# Patient Record
Sex: Female | Born: 1950 | Race: White | Hispanic: No | Marital: Married | Smoking: Former smoker
Health system: Southern US, Community
[De-identification: ages and names within clinical notes are randomized; demographics above are authoritative.]

## PROBLEM LIST (undated history)

## (undated) DIAGNOSIS — F329 Major depressive disorder, single episode, unspecified: Secondary | ICD-10-CM

## (undated) DIAGNOSIS — F909 Attention-deficit hyperactivity disorder, unspecified type: Secondary | ICD-10-CM

## (undated) DIAGNOSIS — F900 Attention-deficit hyperactivity disorder, predominantly inattentive type: Secondary | ICD-10-CM

## (undated) DIAGNOSIS — E079 Disorder of thyroid, unspecified: Secondary | ICD-10-CM

## (undated) DIAGNOSIS — D649 Anemia, unspecified: Secondary | ICD-10-CM

## (undated) DIAGNOSIS — R413 Other amnesia: Secondary | ICD-10-CM

## (undated) DIAGNOSIS — F32A Depression, unspecified: Secondary | ICD-10-CM

## (undated) DIAGNOSIS — R030 Elevated blood-pressure reading, without diagnosis of hypertension: Secondary | ICD-10-CM

## (undated) DIAGNOSIS — I872 Venous insufficiency (chronic) (peripheral): Secondary | ICD-10-CM

## (undated) DIAGNOSIS — Z79890 Hormone replacement therapy (postmenopausal): Secondary | ICD-10-CM

## (undated) HISTORY — DX: Major depressive disorder, single episode, unspecified: F32.9

## (undated) HISTORY — DX: Hormone replacement therapy: Z79.890

## (undated) HISTORY — PX: APPENDECTOMY: SHX54

## (undated) HISTORY — DX: Depression, unspecified: F32.A

## (undated) HISTORY — PX: BUNIONECTOMY: SHX129

## (undated) HISTORY — PX: TUBAL LIGATION: SHX77

## (undated) HISTORY — DX: Attention-deficit hyperactivity disorder, unspecified type: F90.9

## (undated) HISTORY — DX: Other amnesia: R41.3

## (undated) HISTORY — PX: ABDOMINAL HYSTERECTOMY: SHX81

## (undated) HISTORY — PX: CHOLECYSTECTOMY: SHX55

## (undated) HISTORY — PX: ESOPHAGOGASTRODUODENOSCOPY: SHX1529

## (undated) HISTORY — PX: OTHER SURGICAL HISTORY: SHX169

## (undated) HISTORY — DX: Anemia, unspecified: D64.9

## (undated) HISTORY — DX: Elevated blood-pressure reading, without diagnosis of hypertension: R03.0

## (undated) HISTORY — DX: Disorder of thyroid, unspecified: E07.9

---
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
## 2013-01-25 LAB — HM COLONOSCOPY

## 2016-11-08 DIAGNOSIS — E039 Hypothyroidism, unspecified: Secondary | ICD-10-CM | POA: Insufficient documentation

## 2016-11-13 DIAGNOSIS — I872 Venous insufficiency (chronic) (peripheral): Secondary | ICD-10-CM

## 2016-11-13 HISTORY — DX: Venous insufficiency (chronic) (peripheral): I87.2

## 2017-02-08 DIAGNOSIS — E559 Vitamin D deficiency, unspecified: Secondary | ICD-10-CM | POA: Insufficient documentation

## 2017-05-04 DIAGNOSIS — F900 Attention-deficit hyperactivity disorder, predominantly inattentive type: Secondary | ICD-10-CM

## 2017-05-04 DIAGNOSIS — R413 Other amnesia: Secondary | ICD-10-CM | POA: Insufficient documentation

## 2017-05-04 HISTORY — DX: Attention-deficit hyperactivity disorder, predominantly inattentive type: F90.0

## 2017-05-19 LAB — HM MAMMOGRAPHY

## 2017-05-19 LAB — HM DEXA SCAN: HM Dexa Scan: NORMAL

## 2017-06-10 DIAGNOSIS — Z79890 Hormone replacement therapy (postmenopausal): Secondary | ICD-10-CM | POA: Insufficient documentation

## 2017-12-04 DIAGNOSIS — D649 Anemia, unspecified: Secondary | ICD-10-CM | POA: Insufficient documentation

## 2017-12-04 HISTORY — DX: Anemia, unspecified: D64.9

## 2018-03-05 DIAGNOSIS — E782 Mixed hyperlipidemia: Secondary | ICD-10-CM | POA: Insufficient documentation

## 2018-03-06 LAB — BASIC METABOLIC PANEL
Creatinine: 0.8 (ref <=1.1)
Glucose: 83
Potassium: 4.6 (ref 3.4–5.3)
Sodium: 142 (ref 137–147)

## 2018-03-06 LAB — CBC AND DIFFERENTIAL
HCT: 34 — AB (ref 36–46)
Hemoglobin: 11.9 — AB (ref 12.0–16.0)
Platelets: 356 (ref 150–399)
WBC: 7.5

## 2018-03-06 LAB — HEPATIC FUNCTION PANEL
ALT: 11 (ref 7–35)
AST: 14 (ref 13–35)

## 2018-03-06 LAB — TSH: TSH: 1.01 (ref 0.41–5.90)

## 2018-03-07 LAB — LIPID PANEL
Cholesterol: 181 (ref 0–200)
HDL: 58 (ref 35–70)
Triglycerides: 104 (ref 40–160)

## 2018-05-23 ENCOUNTER — Ambulatory Visit
Admission: RE | Admit: 2018-05-23 | Discharge: 2018-05-23 | Disposition: A | Discharge status: Home / Self Care | Payer: Managed Care, Other (non HMO) | Source: Ambulatory Visit | Attending: Family Medicine | Admitting: Family Medicine

## 2018-05-23 ENCOUNTER — Encounter: Payer: Self-pay | Admitting: Family Medicine

## 2018-05-23 ENCOUNTER — Ambulatory Visit (INDEPENDENT_AMBULATORY_CARE_PROVIDER_SITE_OTHER): Payer: Managed Care, Other (non HMO) | Admitting: Family Medicine

## 2018-05-23 VITALS — BP 138/82 | HR 60 | Temp 97.8°F | Resp 12 | Ht 62.5 in | Wt 173.8 lb

## 2018-05-23 DIAGNOSIS — M25512 Pain in left shoulder: Secondary | ICD-10-CM | POA: Insufficient documentation

## 2018-05-23 DIAGNOSIS — G8929 Other chronic pain: Secondary | ICD-10-CM | POA: Insufficient documentation

## 2018-05-23 DIAGNOSIS — F330 Major depressive disorder, recurrent, mild: Secondary | ICD-10-CM

## 2018-05-23 DIAGNOSIS — E039 Hypothyroidism, unspecified: Secondary | ICD-10-CM

## 2018-05-23 DIAGNOSIS — K219 Gastro-esophageal reflux disease without esophagitis: Secondary | ICD-10-CM

## 2018-05-23 DIAGNOSIS — R413 Other amnesia: Secondary | ICD-10-CM

## 2018-05-23 DIAGNOSIS — Z5181 Encounter for therapeutic drug level monitoring: Secondary | ICD-10-CM

## 2018-05-23 DIAGNOSIS — D509 Iron deficiency anemia, unspecified: Secondary | ICD-10-CM

## 2018-05-23 DIAGNOSIS — E559 Vitamin D deficiency, unspecified: Secondary | ICD-10-CM

## 2018-05-23 DIAGNOSIS — E66811 Obesity, class 1: Secondary | ICD-10-CM

## 2018-05-23 DIAGNOSIS — R030 Elevated blood-pressure reading, without diagnosis of hypertension: Secondary | ICD-10-CM

## 2018-05-23 DIAGNOSIS — E538 Deficiency of other specified B group vitamins: Secondary | ICD-10-CM

## 2018-05-23 DIAGNOSIS — E669 Obesity, unspecified: Secondary | ICD-10-CM

## 2018-05-23 MED ORDER — ESCITALOPRAM OXALATE 10 MG PO TABS: 10.0000 mg | ORAL_TABLET | Freq: Every day | ORAL | 0 refills | Status: DC

## 2018-05-23 MED ORDER — LEVOTHYROXINE SODIUM 88 MCG PO CAPS: 1.0000 | ORAL_CAPSULE | Freq: Every day | ORAL | 1 refills | Status: DC

## 2018-05-23 MED ORDER — FAMOTIDINE 20 MG PO TABS: 20.0000 mg | ORAL_TABLET | Freq: Two times a day (BID) | ORAL | 2 refills | Status: DC | PRN

## 2018-05-23 NOTE — Progress Notes (Signed)
Annette Coleman, please let the patient know that the xray of her LEFT shoulder did not show any arthritis or other bony problem; she could still have tendonitis or rotator cuff issues or soft tissue problems; please offer a REFERRAL to ORTHO and PT and enter if she agrees for chronic left shoulder pain

## 2018-05-23 NOTE — Progress Notes (Signed)
BP 138/82   Pulse 60   Temp 97.8 F (36.6 C) (Oral)   Resp 12   Ht 5' 2.5" (1.588 m)   Wt 173 lb 12.8 oz (78.8 kg)   SpO2 96%   BMI 31.28 kg/m    Subjective:    Patient ID: Annette Coleman, female    DOB: 1950-08-01, 68 y.o.   MRN: [id number]  HPI: Annette Coleman is a 68 y.o. female  Chief Complaint  Patient presents with  . Establish Care  . Arm Pain    left   . Hip Pain    left    HPI Patient is here to establish care today She would "like to start a new life" when I asked what she is here for today She does not feel like her medicine is working She was on tirosint before but insurance wasn't covering it; she would like to go back to that one, felt like it was better for her Last TSH was normal in November 2019, through [place]; moved here in November She quit smoking and then got an air-head, foggy; they did a test that it was her thyroid  She gets depressed, "I just get depressed and I shouldn't be because I have a good life, but I do"; so glad to move, thought everything would be great; no energy to clean; pushes herself at work; she does not think the medicine is working; thinks she should have more energy; no thoughts of self-harm; no active thoughts of hurting or hurting others; first episode of depression was in her 16's; no depression in the family No hx of seizures or eating disorders  Doesn't think her memory is there; jokes around that she has mini-strokes and jokes around that she is having mini-strokes; has not seen a neurologist; going on for a year; no carotid testing; no fam hx of carotid endarterectomy, then she remembers that her father had something clogged in his neck and had to do that surgery before they did heart surgery; having problem with her left side, pain in her arm and hand; hand locking up; left hip is bothering her, just pains me so bad, not sure if arthritis; left side is just acting up; left arm is not weak, "just feels like it is not  there sometimes"; started arthiritin, 65 mg of caffeine in it, apap and asa and caffeine, not taking much; was causing shaking; pain wakes her up so bad at night  Blood pressure monitor, skyrocketed; fluctuating up and down, 120 before  Depression screen [facility] 2/9 05/23/2018  Decreased Interest 2  Down, Depressed, Hopeless 2  PHQ - 2 Score 4  Altered sleeping 0  Tired, decreased energy 2  Change in appetite 0  Feeling bad or failure about yourself  0  Trouble concentrating 1  Moving slowly or fidgety/restless 0  Suicidal thoughts 0  PHQ-9 Score 7  Difficult doing work/chores Not difficult at all   Fall Risk  05/23/2018  Falls in the past year? 0  Number falls in past yr: 0  Injury with Fall? 0    Relevant past medical, surgical, family and social history reviewed Past Medical History:  Diagnosis Date  . ADHD   . Anemia   . Depression   . Elevated blood pressure reading without diagnosis of hypertension   . Hormone replacement therapy   . Memory impairment   . Thyroid disease    Past Surgical History:  Procedure Laterality Date  . ABDOMINAL HYSTERECTOMY    .  APPENDECTOMY    . BUNIONECTOMY    . CHOLECYSTECTOMY    . complete parotidectomy    . ESOPHAGOGASTRODUODENOSCOPY    . jaw arthroscopy    . TUBAL LIGATION     Family History  Problem Relation Age of Onset  . Stroke Mother   . Hypertension Mother   . Prostate cancer Father   . Cancer Sister   . Hypertension Sister    Social History   Tobacco Use  . Smoking status: Former Smoker    Last attempt to quit: 2012    Years since quitting: 8.1  . Smokeless tobacco: Never Used  Substance Use Topics  . Alcohol use: Yes    Comment: rare  . Drug use: Not Currently     Office Visit from 05/23/2018 in [facility]  AUDIT-C Score  1      Interim medical history since last visit reviewed. Allergies and medications reviewed  Review of Systems Per HPI unless specifically indicated above       Objective:    BP 138/82   Pulse 60   Temp 97.8 F (36.6 C) (Oral)   Resp 12   Ht 5' 2.5" (1.588 m)   Wt 173 lb 12.8 oz (78.8 kg)   SpO2 96%   BMI 31.28 kg/m   Wt Readings from Last 3 Encounters:  05/23/18 173 lb 12.8 oz (78.8 kg)    Physical Exam Constitutional:      General: She is not in acute distress.    Appearance: She is well-developed. She is obese. She is not diaphoretic.  HENT:     Head: Normocephalic and atraumatic.  Eyes:     General: No scleral icterus. Neck:     Thyroid: No thyromegaly.  Cardiovascular:     Rate and Rhythm: Normal rate and regular rhythm.     Heart sounds: Normal heart sounds. No murmur.  Pulmonary:     Effort: Pulmonary effort is normal. No respiratory distress.     Breath sounds: Normal breath sounds. No wheezing.  Abdominal:     General: Bowel sounds are normal. There is no distension.     Palpations: Abdomen is soft.  Musculoskeletal:     Left shoulder: She exhibits decreased range of motion. She exhibits no deformity and normal strength.  Skin:    General: Skin is warm and dry.     Coloration: Skin is not pale.  Neurological:     Mental Status: She is alert.  Psychiatric:        Mood and Affect: Mood is not anxious or depressed.        Speech: Speech is not rapid and pressured or tangential.        Behavior: Behavior normal.        Thought Content: Thought content normal.        Cognition and Memory: Cognition normal.        Judgment: Judgment normal.     Comments: Very pleasant; exhibits ADHD-like conversation, but not truly tangential or psychotic; appropriate with examiner; good eye contact     Results for orders placed or performed in visit on 05/11/18  HM MAMMOGRAPHY  Result Value Ref Range   HM Mammogram 0-4 Bi-Rad 0-4 Bi-Rad, Self Reported Normal  HM DEXA SCAN  Result Value Ref Range   HM Dexa Scan normal   CBC and differential  Result Value Ref Range   Hemoglobin 11.9 (A) 12.0 - 16.0   HCT 34 (A) 36 - 46  Platelets 356 150 - 399   WBC 7.5   Basic metabolic panel  Result Value Ref Range   Glucose 83    Creatinine 0.8 0.5 - 1.1   Potassium 4.6 3.4 - 5.3   Sodium 142 137 - 147  Lipid panel  Result Value Ref Range   Triglycerides 104 40 - 160   Cholesterol 181 0 - 200   HDL 58 35 - 70  Hepatic function panel  Result Value Ref Range   ALT 11 7 - 35   AST 14 13 - 35  TSH  Result Value Ref Range   TSH 1.01 0.41 - 5.90      Assessment & Plan:   Problem List Items Addressed This Visit      Endocrine   Hypothyroidism - Primary    Recent labs were normal per patient; she wishes to switch to brand name; recheck thyroid labs in 6 weeks; notify me of any changes before then, discussed s/s of over and under replacement      Relevant Medications   Levothyroxine Sodium (TIROSINT) 88 MCG CAPS   Other Relevant Orders   TSH   T4, free     Other   Obesity (BMI 30.0-34.9)    See AVS; encourage weight loss      Relevant Orders   Lipid panel    Other Visit Diagnoses    Memory impairment       Relevant Orders   Ambulatory referral to Neurology   VITAMIN D 25 Hydroxy (Vit-D Deficiency, Fractures)   Lipid panel   Vitamin B12   Iron deficiency anemia, unspecified iron deficiency anemia type       Relevant Medications   cyanocobalamin 1000 MCG tablet   Other Relevant Orders   CBC   Iron, TIBC and Ferritin Panel   Vitamin B12 deficiency       Relevant Orders   Vitamin B12   Vitamin D deficiency disease       Relevant Orders   VITAMIN D 25 Hydroxy (Vit-D Deficiency, Fractures)   Mild episode of recurrent major depressive disorder (HCC)       Relevant Medications   escitalopram (LEXAPRO) 10 MG tablet   Gastroesophageal reflux disease, esophagitis presence not specified       Relevant Medications   esomeprazole (NEXIUM) 20 MG packet   famotidine (PEPCID) 20 MG tablet   Elevated blood pressure reading       Relevant Orders   Lipid panel   Medication monitoring encounter        Relevant Orders   COMPLETE METABOLIC PANEL WITH GFR   Chronic left shoulder pain       Relevant Medications   escitalopram (LEXAPRO) 10 MG tablet   Other Relevant Orders   DG Shoulder Left (Completed)       Follow up plan: Return in about 3 weeks (around 06/13/2018) for follow-up visit with Dr. Sanda Klein.  An after-visit summary was printed and given to the patient at [place].  Please see the patient instructions which may contain other information and recommendations beyond what is mentioned above in the assessment and plan.  Meds ordered this encounter  Medications  . Levothyroxine Sodium (TIROSINT) 88 MCG CAPS    Sig: Take 1 capsule (88 mcg total) by mouth daily before breakfast.    Dispense:  30 capsule    Refill:  1    Changing to brand name  . escitalopram (LEXAPRO) 10 MG tablet    Sig: Take 1 tablet (  10 mg total) by mouth daily. This replaces fluoxetine    Dispense:  30 tablet    Refill:  0    We're stopping fluoxetine  . famotidine (PEPCID) 20 MG tablet    Sig: Take 1 tablet (20 mg total) by mouth 2 (two) times daily as needed for heartburn or indigestion. This replaces Nexium    Dispense:  60 tablet    Refill:  2    Orders Placed This Encounter  Procedures  . DG Shoulder Left  . TSH  . T4, free  . VITAMIN D 25 Hydroxy (Vit-D Deficiency, Fractures)  . Lipid panel  . Vitamin B12  . CBC  . Iron, TIBC and Ferritin Panel  . COMPLETE METABOLIC PANEL WITH GFR  . Ambulatory referral to Neurology

## 2018-05-23 NOTE — Patient Instructions (Addendum)
Please switch to the Tirosint Return in 6 weeks to have your labs checked You don't need an appointment. Just come by between the hours of 8:00 am and noon or between 2:00 pm and 4:00 pm Monday through Friday.  Stop the fluoxetine and start the escitalopram tomorrow (Thursday morning) Continue bupropion (Wellbutrin)  We'll have you see the neurologist Go across the street to have labs done [facility] get labs today If you have not heard anything from my staff in a week about any orders/referrals/studies from today, please contact us here to follow-up [phone]    Check out the information at [email or web address] entitled "Nutrition for Weight Loss: What You Need to Know about Fad Diets" Try to lose between 1-2 pounds per week by taking in fewer calories and burning off more calories You can succeed by limiting portions, limiting foods dense in calories and fat, becoming more active, and drinking 8 glasses of water a day (64 ounces) Don't skip meals, especially breakfast, as skipping meals may alter your metabolism Do not use over-the-counter weight loss pills or gimmicks that claim rapid weight loss A healthy BMI (or body mass index) is between 18.5 and 24.9 You can calculate your ideal BMI at the [place] website ClubMonetize.fr   Obesity, Adult Obesity is the condition of having too much total body fat. Being overweight or obese means that your weight is greater than what is considered healthy for your body size. Obesity is determined by a measurement called BMI. BMI is an estimate of body fat and is calculated from height and weight. For adults, a BMI of 30 or higher is considered obese. Obesity can eventually lead to other health concerns and major illnesses, including:  Stroke.  Coronary artery disease (CAD).  Type 2 diabetes.  Some types of cancer, including cancers of the colon, breast, uterus, and  gallbladder.  Osteoarthritis.  High blood pressure (hypertension).  High cholesterol.  Sleep apnea.  Gallbladder stones.  Infertility problems. What are the causes? The main cause of obesity is taking in (consuming) more calories than your body uses for energy. Other factors that contribute to this condition may include:  Being born with genes that make you more likely to become obese.  Having a medical condition that causes obesity. These conditions include: ? Hypothyroidism. ? Polycystic ovarian syndrome (PCOS). ? Binge-eating disorder. ? Cushing syndrome.  Taking certain medicines, such as steroids, antidepressants, and seizure medicines.  Not being physically active (sedentary lifestyle).  Living where there are limited places to exercise safely or buy healthy foods.  Not getting enough sleep. What increases the risk? The following factors may increase your risk of this condition:  Having a family history of obesity.  Being a woman of African-American descent.  Being a man of Hispanic descent. What are the signs or symptoms? Having excessive body fat is the main symptom of this condition. How is this diagnosed? This condition may be diagnosed based on:  Your symptoms.  Your medical history.  A physical exam. Your health care provider may measure: ? Your BMI. If you are an adult with a BMI between 25 and less than 30, you are considered overweight. If you are an adult with a BMI of 30 or higher, you are considered obese. ? The distances around your hips and your waist (circumferences). These may be compared to each other to help diagnose your condition. ? Your skinfold thickness. Your health care provider may gently pinch a fold of your skin and measure it. How  is this treated? Treatment for this condition often includes changing your lifestyle. Treatment may include some or all of the following:  Dietary changes. Work with your health care provider and a  dietitian to set a weight-loss goal that is healthy and reasonable for you. Dietary changes may include eating: ? Smaller portions. A portion size is the amount of a particular food that is healthy for you to eat at one time. This varies from person to person. ? Low-calorie or low-fat options. ? More whole grains, fruits, and vegetables.  Regular physical activity. This may include aerobic activity (cardio) and strength training.  Medicine to help you lose weight. Your health care provider may prescribe medicine if you are unable to lose 1 pound a week after 6 weeks of eating more healthily and doing more physical activity.  Surgery. Surgical options may include gastric banding and gastric bypass. Surgery may be done if: ? Other treatments have not helped to improve your condition. ? You have a BMI of 40 or higher. ? You have life-threatening health problems related to obesity. Follow these instructions at home:  Eating and drinking   Follow recommendations from your health care provider about what you eat and drink. Your health care provider may advise you to: ? Limit fast foods, sweets, and processed snack foods. ? Choose low-fat options, such as low-fat milk instead of whole milk. ? Eat 5 or more servings of fruits or vegetables every day. ? Eat at home more often. This gives you more control over what you eat. ? Choose healthy foods when you eat out. ? Learn what a healthy portion size is. ? Keep low-fat snacks on hand. ? Avoid sugary drinks, such as soda, fruit juice, iced tea sweetened with sugar, and flavored milk. ? Eat a healthy breakfast.  Drink enough water to keep your urine clear or pale yellow.  Do not go without eating for long periods of time (do not fast) or follow a fad diet. Fasting and fad diets can be unhealthy and even dangerous. Physical Activity  Exercise regularly, as told by your health care provider. Ask your health care provider what types of exercise are  safe for you and how often you should exercise.  Warm up and stretch before being active.  Cool down and stretch after being active.  Rest between periods of activity. Lifestyle  Limit the time that you spend in front of your TV, computer, or video game system.  Find ways to reward yourself that do not involve food.  Limit alcohol intake to no more than 1 drink a day for nonpregnant women and 2 drinks a day for men. One drink equals 12 oz of beer, 5 oz of wine, or 1 oz of hard liquor. General instructions  Keep a weight loss journal to keep track of the food you eat and how much you exercise you get.  Take over-the-counter and prescription medicines only as told by your health care provider.  Take vitamins and supplements only as told by your health care provider.  Consider joining a support group. Your health care provider may be able to recommend a support group.  Keep all follow-up visits as told by your health care provider. This is important. Contact a health care provider if:  You are unable to meet your weight loss goal after 6 weeks of dietary and lifestyle changes. This information is not intended to replace advice given to you by your health care provider. Make sure you discuss any  questions you have with your health care provider. Document Released: 05/12/2004 Document Revised: 09/07/2015 Document Reviewed: 01/21/2015 Elsevier Interactive Patient Education  2019 Elsevier Inc.  Preventing Unhealthy [employer], Adult Staying at a healthy weight is important to your overall health. When fat builds up in your body, you may become overweight or obese. Being overweight or obese increases your risk of developing certain health problems, such as heart disease, diabetes, sleeping problems, joint problems, and some types of cancer. Unhealthy weight gain is often the result of making unhealthy food choices or not getting enough exercise. You can make changes to your lifestyle to  prevent obesity and stay as healthy as possible. What nutrition changes can be made?   Eat only as much as your body needs. To do this: ? Pay attention to signs that you are hungry or full. Stop eating as soon as you feel full. ? If you feel hungry, try drinking water first before eating. Drink enough water so your urine is clear or pale yellow. ? Eat smaller portions. Pay attention to portion sizes when eating out. ? Look at serving sizes on food labels. Most foods contain more than one serving per container. ? Eat the recommended number of calories for your gender and activity level. For most active people, a daily total of 2,000 calories is appropriate. If you are trying to lose weight or are not very active, you may need to eat fewer calories. Talk with your health care provider or a diet and nutrition specialist (dietitian) about how many calories you need each day.  Choose healthy foods, such as: ? Fruits and vegetables. At each meal, try to fill at least half of your plate with fruits and vegetables. ? Whole grains, such as whole-wheat bread, brown rice, and quinoa. ? Lean meats, such as chicken or fish. ? Other healthy proteins, such as beans, eggs, or tofu. ? Healthy fats, such as nuts, seeds, fatty fish, and olive oil. ? Low-fat or fat-free dairy products.  Check food labels, and avoid food and drinks that: ? Are high in calories. ? Have added sugar. ? Are high in sodium. ? Have saturated fats or trans fats.  Cook foods in healthier ways, such as by baking, broiling, or grilling.  Make a meal plan for the week, and shop with a grocery list to help you stay on track with your purchases. Try to avoid going to the grocery store when you are hungry.  When grocery shopping, try to shop around the outside of the store first, where the fresh foods are. Doing this helps you to avoid prepackaged foods, which can be high in sugar, salt (sodium), and fat. What lifestyle changes can be  made?   Exercise for 30 or more minutes on 5 or more days each week. Exercising may include brisk walking, yard work, biking, running, swimming, and team sports like basketball and soccer. Ask your health care provider which exercises are safe for you.  Do muscle-strengthening activities, such as lifting weights or using resistance bands, on 2 or more days a week.  Do not use any products that contain nicotine or tobacco, such as cigarettes and e-cigarettes. If you need help quitting, ask your health care provider.  Limit alcohol intake to no more than 1 drink a day for nonpregnant women and 2 drinks a day for men. One drink equals 12 oz of beer, 5 oz of wine, or 1 oz of hard liquor.  Try to get 7-9 hours of sleep  each night. What other changes can be made?  Keep a food and activity journal to keep track of: ? What you ate and how many calories you had. Remember to count the calories in sauces, dressings, and side dishes. ? Whether you were active, and what exercises you did. ? Your calorie, weight, and activity goals.  Check your weight regularly. Track any changes. If you notice you have gained weight, make changes to your diet or activity routine.  Avoid taking weight-loss medicines or supplements. Talk to your health care provider before starting any new medicine or supplement.  Talk to your health care provider before trying any new diet or exercise plan. Why are these changes important? Eating healthy, staying active, and having healthy habits can help you to prevent obesity. Those changes also:  Help you manage stress and emotions.  Help you connect with friends and family.  Improve your self-esteem.  Improve your sleep.  Prevent long-term health problems. What can happen if changes are not made? Being obese or overweight can cause you to develop joint or bone problems, which can make it hard for you to stay active or do activities you enjoy. Being obese or overweight  also puts stress on your heart and lungs and can lead to health problems like diabetes, heart disease, and some cancers. Where to find more information Talk with your health care provider or a dietitian about healthy eating and healthy lifestyle choices. You may also find information from:  U.S. Department of Agriculture, MyPlate: FormerBoss.no  American Heart Association: www.heart.org  Centers for Disease Control and Prevention: http://www.wolf.info/ Summary  Staying at a healthy weight is important to your overall health. It helps you to prevent certain diseases and health problems, such as heart disease, diabetes, joint problems, sleep disorders, and some types of cancer.  Being obese or overweight can cause you to develop joint or bone problems, which can make it hard for you to stay active or do activities you enjoy.  You can prevent unhealthy weight gain by eating a healthy diet, exercising regularly, not smoking, limiting alcohol, and getting enough sleep.  Talk with your health care provider or a dietitian for guidance about healthy eating and healthy lifestyle choices. This information is not intended to replace advice given to you by your health care provider. Make sure you discuss any questions you have with your health care provider. Document Released: 04/05/2016 Document Revised: 01/13/2017 Document Reviewed: 05/11/2016 Elsevier Interactive Patient Education  2019 [employer].

## 2018-05-23 NOTE — Assessment & Plan Note (Signed)
Recent labs were normal per patient; she wishes to switch to brand name; recheck thyroid labs in 6 weeks; notify me of any changes before then, discussed s/s of over and under replacement

## 2018-05-23 NOTE — Assessment & Plan Note (Signed)
See AVS; encourage weight loss

## 2018-05-24 ENCOUNTER — Telehealth: Payer: Self-pay | Admitting: Family Medicine

## 2018-05-24 DIAGNOSIS — M25512 Pain in left shoulder: Secondary | ICD-10-CM

## 2018-05-24 LAB — LIPID PANEL
Cholesterol: 210 mg/dL — ABNORMAL HIGH (ref <200)
HDL: 62 mg/dL (ref >=50)
LDL Cholesterol (Calc): 119 mg/dL (calc) — ABNORMAL HIGH
Non-HDL Cholesterol (Calc): 148 mg/dL (calc) — ABNORMAL HIGH (ref <130)
Total CHOL/HDL Ratio: 3.4 (calc) (ref <5.0)
Triglycerides: 170 mg/dL — ABNORMAL HIGH (ref <150)

## 2018-05-24 LAB — CBC
HCT: 34.3 % — ABNORMAL LOW (ref 35.0–45.0)
Hemoglobin: 12.1 g/dL (ref 11.7–15.5)
MCH: 33 pg (ref 27.0–33.0)
MCHC: 35.3 g/dL (ref 32.0–36.0)
MCV: 93.5 fL (ref 80.0–100.0)
MPV: 9.8 fL (ref 7.5–12.5)
Platelets: 333 10*3/uL (ref 140–400)
RBC: 3.67 10*6/uL — ABNORMAL LOW (ref 3.80–5.10)
RDW: 11.8 % (ref 11.0–15.0)
WBC: 6.4 10*3/uL (ref 3.8–10.8)

## 2018-05-24 LAB — VITAMIN D 25 HYDROXY (VIT D DEFICIENCY, FRACTURES): Vit D, 25-Hydroxy: 34 ng/mL (ref 30–100)

## 2018-05-24 LAB — COMPLETE METABOLIC PANEL WITH GFR
AG Ratio: 1.6 (calc) (ref 1.0–2.5)
ALT: 24 U/L (ref 6–29)
AST: 24 U/L (ref 10–35)
Albumin: 4.2 g/dL (ref 3.6–5.1)
Alkaline phosphatase (APISO): 48 U/L (ref 37–153)
BUN: 14 mg/dL (ref 7–25)
CO2: 28 mmol/L (ref 20–32)
Calcium: 9.3 mg/dL (ref 8.6–10.4)
Chloride: 104 mmol/L (ref 98–110)
Creat: 0.82 mg/dL (ref 0.50–0.99)
GFR, Est African American: 86 mL/min/{1.73_m2} (ref >=60)
GFR, Est Non African American: 75 mL/min/{1.73_m2} (ref >=60)
Globulin: 2.7 g/dL (calc) (ref 1.9–3.7)
Glucose, Bld: 87 mg/dL (ref 65–139)
Potassium: 4.6 mmol/L (ref 3.5–5.3)
Sodium: 139 mmol/L (ref 135–146)
Total Bilirubin: 0.4 mg/dL (ref 0.2–1.2)
Total Protein: 6.9 g/dL (ref 6.1–8.1)

## 2018-05-24 LAB — VITAMIN B12: Vitamin B-12: 1646 pg/mL — ABNORMAL HIGH (ref 200–1100)

## 2018-05-24 LAB — IRON,TIBC AND FERRITIN PANEL
%SAT: 33 % (calc) (ref 16–45)
Ferritin: 73 ng/mL (ref 16–288)
Iron: 104 ug/dL (ref 45–160)
TIBC: 316 mcg/dL (calc) (ref 250–450)

## 2018-05-24 LAB — TSH: TSH: 0.36 mIU/L — ABNORMAL LOW (ref 0.40–4.50)

## 2018-05-24 LAB — T4, FREE: Free T4: 1.5 ng/dL (ref 0.8–1.8)

## 2018-05-24 NOTE — Telephone Encounter (Signed)
-----   Message from Samson Frederic sent at 05/24/2018 10:31 AM EST ----- Pt given results per Dr Sanda Klein, "please let the patient know that the xray of her LEFT shoulder did not show any arthritis or other bony problem; she could still have tendonitis or rotator cuff issues or soft tissue problems; please offer a REFERRAL to [facility] and PT and enter if she agrees for chronic left shoulder pain"; she verbalizes understanding and says she "would like to try ortho first"; she can be contacted at [phone] and a message can be left; will route to office for notification.

## 2018-05-24 NOTE — Telephone Encounter (Signed)
Please REFER and let her know we're sending her there

## 2018-05-25 ENCOUNTER — Telehealth: Payer: Self-pay | Admitting: Family Medicine

## 2018-05-25 DIAGNOSIS — Z86010 Personal history of colonic polyps: Secondary | ICD-10-CM

## 2018-05-25 NOTE — Telephone Encounter (Signed)
Pt.notified

## 2018-05-25 NOTE — Telephone Encounter (Signed)
Patient is overdue for colon cancer screening Last colonoscopy was 2014; next due 3 years later I am ordering referral to GI Please let her know; inform [name] if she wants to go somewhere in particular

## 2018-05-27 ENCOUNTER — Other Ambulatory Visit: Payer: Self-pay | Admitting: Family Medicine

## 2018-05-27 DIAGNOSIS — E782 Mixed hyperlipidemia: Secondary | ICD-10-CM

## 2018-05-27 MED ORDER — ATORVASTATIN CALCIUM 10 MG PO TABS: 10.0000 mg | ORAL_TABLET | Freq: Every day | ORAL | 1 refills | Status: DC

## 2018-05-28 ENCOUNTER — Encounter: Payer: Self-pay | Admitting: Family Medicine

## 2018-05-28 NOTE — Telephone Encounter (Signed)
Left detailed voicemail

## 2018-06-04 ENCOUNTER — Encounter: Payer: Self-pay | Admitting: *Deleted

## 2018-06-13 ENCOUNTER — Telehealth: Payer: Self-pay | Admitting: Gastroenterology

## 2018-06-13 ENCOUNTER — Ambulatory Visit (INDEPENDENT_AMBULATORY_CARE_PROVIDER_SITE_OTHER): Payer: Managed Care, Other (non HMO) | Admitting: Family Medicine

## 2018-06-13 ENCOUNTER — Encounter: Payer: Self-pay | Admitting: Family Medicine

## 2018-06-13 VITALS — BP 128/74 | HR 72 | Temp 98.8°F | Ht 63.0 in | Wt 167.8 lb

## 2018-06-13 DIAGNOSIS — R413 Other amnesia: Secondary | ICD-10-CM | POA: Diagnosis not present

## 2018-06-13 DIAGNOSIS — E782 Mixed hyperlipidemia: Secondary | ICD-10-CM

## 2018-06-13 DIAGNOSIS — E039 Hypothyroidism, unspecified: Secondary | ICD-10-CM | POA: Diagnosis not present

## 2018-06-13 DIAGNOSIS — Z1239 Encounter for other screening for malignant neoplasm of breast: Secondary | ICD-10-CM | POA: Diagnosis not present

## 2018-06-13 MED ORDER — PRAVASTATIN SODIUM 20 MG PO TABS: 20.0000 mg | ORAL_TABLET | Freq: Every day | ORAL | 0 refills | Status: DC

## 2018-06-13 MED ORDER — ESCITALOPRAM OXALATE 10 MG PO TABS: 10.0000 mg | ORAL_TABLET | Freq: Every day | ORAL | 1 refills | Status: DC

## 2018-06-13 NOTE — Telephone Encounter (Signed)
Pt is calling to schedule procedure.

## 2018-06-13 NOTE — Progress Notes (Signed)
BP 128/74   Pulse 72   Temp 98.8 F (37.1 C)   Ht 5\' 3"  (1.6 m)   Wt 167 lb 12.8 oz (76.1 kg)   SpO2 93%   BMI 29.72 kg/m    Subjective:    Patient ID: Annette Coleman, female    DOB: 1951-02-25, 68 y.o.   MRN: [id number]  HPI: Annette Coleman is a 68 y.o. female  Chief Complaint  Patient presents with  . Follow-up    HPI No medical excitement since last visit  Feels better since last visit; doing well on wellbutrin and escitalopram; also more energy on new thyroid medicine; no palpitations; no chest; no tremors; no loose stools; no weight gain or constipation; stopped eating ice cream, wonders what she'll give up next  BP down today from last time  Memory issues discussed last visit; has upcoming appointment with neurologist several weeks  She is going to get her colonoscopy, can't answer phone at work and has a letter; she will call them; no blood in the stool; no abdominal bloating; no colon cancer   High cholesterol; started to have weird dreams and now she is having weird dreams and remembering them; not scary or disturbing  On meloxicam but she doesn't want to take it  She does not think the famotidine is helping as much  Depression screen [facility] 2/9 06/13/2018 05/23/2018  Decreased Interest 0 2  Down, Depressed, Hopeless 0 2  PHQ - 2 Score 0 4  Altered sleeping 0 0  Tired, decreased energy 0 2  Change in appetite 0 0  Feeling bad or failure about yourself  0 0  Trouble concentrating 0 1  Moving slowly or fidgety/restless 0 0  Suicidal thoughts 0 0  PHQ-9 Score 0 7  Difficult doing work/chores Not difficult at all Not difficult at all   Fall Risk  06/13/2018 05/23/2018  Falls in the past year? 0 0  Number falls in past yr: - 0  Injury with Fall? - 0    Relevant past medical, surgical, family and social history reviewed Past Medical History:  Diagnosis Date  . ADHD   . Anemia   . Depression   . Elevated blood pressure reading without diagnosis of  hypertension   . Hormone replacement therapy   . Memory impairment   . Thyroid disease    Past Surgical History:  Procedure Laterality Date  . ABDOMINAL HYSTERECTOMY    . APPENDECTOMY    . BUNIONECTOMY    . CHOLECYSTECTOMY    . complete parotidectomy    . ESOPHAGOGASTRODUODENOSCOPY    . jaw arthroscopy    . TUBAL LIGATION     Family History  Problem Relation Age of Onset  . Stroke Mother   . Hypertension Mother   . Prostate cancer Father   . Cancer Sister   . Hypertension Sister    Social History   Tobacco Use  . Smoking status: Former Smoker    Last attempt to quit: 2012    Years since quitting: 8.1  . Smokeless tobacco: Never Used  Substance Use Topics  . Alcohol use: Yes    Comment: rare  . Drug use: Not Currently     [facility] Visit from 06/13/2018 in [facility]  AUDIT-C Score  0      Interim medical history since last visit reviewed. Allergies and medications reviewed  Review of Systems Per HPI unless specifically indicated above     Objective:    BP  128/74   Pulse 72   Temp 98.8 F (37.1 C)   Ht 5\' 3"  (1.6 m)   Wt 167 lb 12.8 oz (76.1 kg)   SpO2 93%   BMI 29.72 kg/m   Wt Readings from Last 3 Encounters:  06/13/18 167 lb 12.8 oz (76.1 kg)  05/23/18 173 lb 12.8 oz (78.8 kg)    Physical Exam Constitutional:      General: She is not in acute distress.    Appearance: She is well-developed. She is not diaphoretic.  HENT:     Head: Normocephalic and atraumatic.  Eyes:     General: No scleral icterus. Neck:     Thyroid: No thyromegaly.  Cardiovascular:     Rate and Rhythm: Normal rate and regular rhythm.     Heart sounds: Normal heart sounds. No murmur.  Pulmonary:     Effort: Pulmonary effort is normal. No respiratory distress.     Breath sounds: Normal breath sounds. No wheezing.  Abdominal:     General: Bowel sounds are normal. There is no distension.     Palpations: Abdomen is soft.  Skin:    General: Skin is warm  and dry.     Coloration: Skin is not pale.  Neurological:     Mental Status: She is alert.  Psychiatric:        Behavior: Behavior normal.        Thought Content: Thought content normal.        Judgment: Judgment normal.    Results for orders placed or performed in visit on 05/24/18  [facility] COLONOSCOPY  Result Value Ref Range   HM Colonoscopy See Report (in chart) See Report (in chart), Patient Reported      Assessment & Plan:   Problem List Items Addressed This Visit      Endocrine   Hypothyroidism - Primary   Relevant Orders   TSH   T4, free     Other   Mixed hyperlipidemia    Will switch to a statin that has less blood brain barrier crossing; recheck lipids after new therapy x 6 weeks; healthy eating encouraged      Relevant Medications   pravastatin (PRAVACHOL) 20 MG tablet   Other Relevant Orders   Lipid panel   Memory impairment    Will see neurologist soon       Other Visit Diagnoses    Screening for breast cancer       mammograms yearly   Relevant Orders   MM 3D SCREEN BREAST BILATERAL       Follow up plan: Return in about 6 months (around 12/12/2018) for follow-up visit with Dr. Sanda Klein.  An after-visit summary was printed and given to the patient at [place].  Please see the patient instructions which may contain other information and recommendations beyond what is mentioned above in the assessment and plan.  Meds ordered this encounter  Medications  . pravastatin (PRAVACHOL) 20 MG tablet    Sig: Take 1 tablet (20 mg total) by mouth at bedtime. For cholesterol    Dispense:  90 tablet    Refill:  0    STOP the atorvastatin  . escitalopram (LEXAPRO) 10 MG tablet    Sig: Take 1 tablet (10 mg total) by mouth daily.    Dispense:  90 tablet    Refill:  1    Okay for 90 day supply if insurance wants, or you can give her 30 days at a time -- no worries with me  Orders Placed This Encounter  Procedures  . MM 3D SCREEN BREAST BILATERAL  . TSH  . T4,  free  . Lipid panel

## 2018-06-13 NOTE — Assessment & Plan Note (Signed)
Will switch to a statin that has less blood brain barrier crossing; recheck lipids after new therapy x 6 weeks; healthy eating encouraged

## 2018-06-13 NOTE — Patient Instructions (Addendum)
Please come in on or shortly after April 8th for labs You don't need an appointment. Just come by between the hours of 8:00 am and noon or between 2:00 pm and 4:00 pm Monday through Friday. Pick up the famotidine; I sent the prescription on February 5th  If you need something for aches or pains, try to use Tylenol (acetaminophen) instead of non-steroidals (which include Aleve, ibuprofen, Advil, Motrin, and naproxen); non-steroidals can cause long-term kidney damage They can also increase your risk of bleeding and ulcers  Caution: prolonged use of proton pump inhibitors like omeprazole (Prilosec), pantoprazole (Protonix), esomeprazole (Nexium), and others like Dexilant and Aciphex may increase your risk of pneumonia, Clostridium difficile colitis, osteoporosis, anemia and other health complications Try to limit or avoid triggers like coffee, caffeinated beverages, onions, chocolate, spicy foods, peppermint, acidic foods like pizza, spaghetti sauce, and orange juice Lose weight if you are overweight or obese Try elevating the head of your bed by placing a small wedge between your mattress and box springs to keep acid in the stomach at night instead of coming up into your esophagus

## 2018-06-13 NOTE — Assessment & Plan Note (Signed)
Will see neurologist soon

## 2018-06-14 NOTE — Telephone Encounter (Signed)
Returned call to schedule colonoscopy.  LVM for her to call back.  Thanks [employer]

## 2018-06-20 ENCOUNTER — Telehealth: Payer: Self-pay | Admitting: Gastroenterology

## 2018-06-20 NOTE — Telephone Encounter (Signed)
Pts call has been returned.  She was at grocery store and did not have her schedule.  Stated she will call back when she gets home.  Thanks [employer]

## 2018-06-20 NOTE — Telephone Encounter (Signed)
Patient is returning call to schedule a colonoscopy.

## 2018-07-15 ENCOUNTER — Other Ambulatory Visit: Payer: Self-pay | Admitting: Family Medicine

## 2018-07-16 NOTE — Telephone Encounter (Signed)
Pt.notified

## 2018-07-16 NOTE — Telephone Encounter (Signed)
She should not be out of medicine for a few more days Someone marked that she wasn't taking her tirosynt in February; please call her to confirm  Please see last result note: Annette Coleman, please let the patient know that her thyroid test is just a few hundredths of a point out of range. Since we're changing her dose, it will very important to check her TSH again in 6 weeks.  Please ask her to have labs done ASAP

## 2018-07-17 ENCOUNTER — Other Ambulatory Visit: Payer: Self-pay

## 2018-07-17 ENCOUNTER — Other Ambulatory Visit: Payer: Self-pay | Admitting: Family Medicine

## 2018-07-17 DIAGNOSIS — E039 Hypothyroidism, unspecified: Secondary | ICD-10-CM

## 2018-07-17 DIAGNOSIS — E782 Mixed hyperlipidemia: Secondary | ICD-10-CM

## 2018-07-17 NOTE — Telephone Encounter (Signed)
See note from yesterday She shouldn't be out yet Labs are requested

## 2018-07-18 ENCOUNTER — Other Ambulatory Visit: Payer: Self-pay | Admitting: Family Medicine

## 2018-07-18 DIAGNOSIS — E039 Hypothyroidism, unspecified: Secondary | ICD-10-CM

## 2018-07-18 LAB — LIPID PANEL
Cholesterol: 153 mg/dL (ref <200)
HDL: 53 mg/dL (ref >=50)
LDL Cholesterol (Calc): 77 mg/dL (calc)
Non-HDL Cholesterol (Calc): 100 mg/dL (calc) (ref <130)
Total CHOL/HDL Ratio: 2.9 (calc) (ref <5.0)
Triglycerides: 125 mg/dL (ref <150)

## 2018-07-18 LAB — T4, FREE: Free T4: 1.4 ng/dL (ref 0.8–1.8)

## 2018-07-18 LAB — TSH: TSH: 0.26 mIU/L — ABNORMAL LOW (ref 0.40–4.50)

## 2018-07-18 MED ORDER — LEVOTHYROXINE SODIUM 75 MCG PO CAPS: 1.0000 | ORAL_CAPSULE | Freq: Every day | ORAL | 0 refills | Status: DC

## 2018-07-18 MED ORDER — PRAVASTATIN SODIUM 20 MG PO TABS: 20.0000 mg | ORAL_TABLET | Freq: Every day | ORAL | 1 refills | Status: DC

## 2018-07-18 NOTE — Progress Notes (Signed)
Annette Coleman, please let the patient know that her cholesterol is better; total dropped from 210 to 153; LDL cholesterol came down from 119 to 77; continue statin and healthy lifestyle changes She is getting too much thyroid medicine; we need to change her dose; stop current 88 mcg and start new 75 mcg; I sent Rx; recheck TSH in 6-8 weeks (I'll order); thank you

## 2018-07-24 ENCOUNTER — Telehealth: Payer: Self-pay | Admitting: Gastroenterology

## 2018-07-24 NOTE — Telephone Encounter (Signed)
Gastroenterology Pre-Procedure Review  Request Date: Friday 10/26/2018  [facility] Requesting Physician: Dr. Bonna Gains  PATIENT REVIEW QUESTIONS: The patient responded to the following health history questions as indicated:    1. Are you having any GI issues? no 2. Do you have a personal history of Polyps? yes (Patient not susre) 3. Do you have a family history of Colon Cancer or Polyps? no 4. Diabetes Mellitus? no 5. Joint replacements in the past 12 months?no 6. Major health problems in the past 3 months?no 7. Any artificial heart valves, MVP, or defibrillator?no    MEDICATIONS & ALLERGIES:    Patient reports the following regarding taking any anticoagulation/antiplatelet therapy:   Plavix, Coumadin, Eliquis, Xarelto, Lovenox, Pradaxa, Brilinta, or Effient? no Aspirin? no  Patient confirms/reports the following medications:  Current Outpatient Medications  Medication Sig Dispense Refill  . buPROPion (WELLBUTRIN XL) 300 MG 24 hr tablet Take 300 mg by mouth daily.    . cholecalciferol (VITAMIN D3) 25 MCG (1000 UT) tablet Take 1,000 Units by mouth daily.    . cyanocobalamin 1000 MCG tablet Take 1,000 mcg by mouth daily.    Marland Kitchen ELDERBERRY PO Take 1 tablet by mouth daily.    Marland Kitchen escitalopram (LEXAPRO) 10 MG tablet Take 1 tablet (10 mg total) by mouth daily. 90 tablet 1  . estradiol (ESTRACE) 0.5 MG tablet Take 0.5 mg by mouth daily.    . famotidine (PEPCID) 20 MG tablet Take 1 tablet (20 mg total) by mouth 2 (two) times daily as needed for heartburn or indigestion. This replaces Nexium (Patient not taking: Reported on 06/13/2018) 60 tablet 2  . fluocinonide cream (LIDEX) 7.07 % Apply 1 application topically 2 (two) times daily.    . Levothyroxine Sodium (TIROSINT) 75 MCG CAPS Take 1 capsule (75 mcg total) by mouth daily before breakfast. 90 capsule 0  . meloxicam (MOBIC) 15 MG tablet meloxicam 15 mg tablet  Take 1 tablet every day by oral route.    . NON FORMULARY Take 1 tablet by mouth as  needed. arthriten    . pravastatin (PRAVACHOL) 20 MG tablet Take 1 tablet (20 mg total) by mouth at bedtime. For cholesterol 90 tablet 1   No current facility-administered medications for this visit.     Patient confirms/reports the following allergies:  No Known Allergies  No orders of the defined types were placed in this encounter.   AUTHORIZATION INFORMATION Primary Insurance: 1D#: Group #:  Secondary Insurance: 1D#: Group #:  SCHEDULE INFORMATION: Date:  Time: Location:

## 2018-07-25 ENCOUNTER — Telehealth: Payer: Self-pay

## 2018-07-25 ENCOUNTER — Other Ambulatory Visit: Payer: Self-pay

## 2018-07-25 DIAGNOSIS — Z86010 Personal history of colonic polyps: Secondary | ICD-10-CM

## 2018-07-25 NOTE — Telephone Encounter (Signed)
Colonoscopy instructions reviewed with patient.  Colonoscopy order has been placed and letter prepared.  Thanks [employer]

## 2018-08-16 ENCOUNTER — Other Ambulatory Visit: Payer: Self-pay

## 2018-08-16 DIAGNOSIS — E039 Hypothyroidism, unspecified: Secondary | ICD-10-CM

## 2018-08-17 LAB — TSH: TSH: 0.75 mIU/L (ref 0.40–4.50)

## 2018-08-20 ENCOUNTER — Other Ambulatory Visit: Payer: Self-pay

## 2018-08-20 MED ORDER — LEVOTHYROXINE SODIUM 75 MCG PO CAPS: 1.0000 | ORAL_CAPSULE | Freq: Every day | ORAL | 0 refills | Status: DC

## 2018-08-20 NOTE — Telephone Encounter (Signed)
Copied from [place] [phone]. Topic: General - Other >> Aug 20, 2018  9:03 AM Burchel, Abbi R wrote: Pt called to get lab results.  Pt states she is almost out of Thyroid medication and needs to know if dosage will stay the same.  Please advise.   Pt: [phone]  Patient was informed of lab results.  Refill request was sent to Suezanne Cheshire, DNP for approval and submission.

## 2018-08-21 ENCOUNTER — Emergency Department: Payer: Managed Care, Other (non HMO)

## 2018-08-21 ENCOUNTER — Emergency Department
Admission: EM | Admit: 2018-08-21 | Discharge: 2018-08-21 | Disposition: A | Discharge status: Home / Self Care | Payer: Managed Care, Other (non HMO) | Attending: Emergency Medicine | Admitting: Emergency Medicine

## 2018-08-21 ENCOUNTER — Other Ambulatory Visit: Payer: Self-pay

## 2018-08-21 DIAGNOSIS — Z87891 Personal history of nicotine dependence: Secondary | ICD-10-CM | POA: Insufficient documentation

## 2018-08-21 DIAGNOSIS — M549 Dorsalgia, unspecified: Secondary | ICD-10-CM | POA: Insufficient documentation

## 2018-08-21 DIAGNOSIS — E039 Hypothyroidism, unspecified: Secondary | ICD-10-CM | POA: Insufficient documentation

## 2018-08-21 DIAGNOSIS — Z79899 Other long term (current) drug therapy: Secondary | ICD-10-CM | POA: Insufficient documentation

## 2018-08-21 MED ORDER — MELOXICAM 7.5 MG PO TABS: 7.5000 mg | ORAL_TABLET | Freq: Every day | ORAL | 0 refills | Status: DC

## 2018-08-21 MED ORDER — BACLOFEN 10 MG PO TABS: 10.0000 mg | ORAL_TABLET | Freq: Every day | ORAL | 0 refills | Status: DC

## 2018-08-21 MED ORDER — LIDOCAINE 5 % EX PTCH: 1.0000 | MEDICATED_PATCH | CUTANEOUS | 0 refills | Status: DC

## 2018-08-21 NOTE — Discharge Instructions (Signed)
Your x-rays are reassuring.  Use ice today and heat tomorrow.  You can take Mobic for inflammation and baclofen to help relax your muscles.  Please follow-up with primary care.
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
## 2018-10-06 ENCOUNTER — Other Ambulatory Visit: Payer: Self-pay | Admitting: Family Medicine

## 2018-10-23 ENCOUNTER — Other Ambulatory Visit: Payer: Self-pay

## 2018-10-23 ENCOUNTER — Other Ambulatory Visit
Admission: RE | Admit: 2018-10-23 | Discharge: 2018-10-23 | Disposition: A | Discharge status: Home / Self Care | Payer: Medicare Other | Source: Ambulatory Visit | Attending: Gastroenterology | Admitting: Gastroenterology

## 2018-10-23 ENCOUNTER — Telehealth: Payer: Self-pay | Admitting: Gastroenterology

## 2018-10-23 DIAGNOSIS — Z01812 Encounter for preprocedural laboratory examination: Secondary | ICD-10-CM

## 2018-10-23 NOTE — Telephone Encounter (Signed)
Patient called & l/m to cancel colonoscopy on 10-26-18 because if she has the covid test she would need to stay out of work until the results came back & she does not have the time.

## 2018-10-24 NOTE — Telephone Encounter (Signed)
Contacted Laquita in Endoscopy to make her aware of patients request to cancel 10/26/18 procedure with Dr. Bonna Gains, due to patient will need to stay out of work until results come in.  She works at [employer] and would be unable to stay out of work.  Thanks [employer]

## 2018-10-26 ENCOUNTER — Ambulatory Visit: Admit: 2018-10-26 | Payer: Medicare Other | Admitting: Gastroenterology

## 2018-10-26 SURGERY — COLONOSCOPY WITH PROPOFOL
Anesthesia: General

## 2018-10-28 ENCOUNTER — Other Ambulatory Visit: Payer: Self-pay | Admitting: Family Medicine

## 2018-11-29 ENCOUNTER — Other Ambulatory Visit: Payer: Self-pay

## 2018-11-29 DIAGNOSIS — Z86010 Personal history of colonic polyps: Secondary | ICD-10-CM

## 2018-12-04 ENCOUNTER — Ambulatory Visit (INDEPENDENT_AMBULATORY_CARE_PROVIDER_SITE_OTHER): Payer: Managed Care, Other (non HMO) | Admitting: Family Medicine

## 2018-12-04 ENCOUNTER — Encounter: Payer: Self-pay | Admitting: Family Medicine

## 2018-12-04 ENCOUNTER — Other Ambulatory Visit: Payer: Self-pay

## 2018-12-04 VITALS — BP 130/84 | HR 72 | Temp 97.8°F | Resp 16 | Ht 63.0 in | Wt 173.8 lb

## 2018-12-04 DIAGNOSIS — K0889 Other specified disorders of teeth and supporting structures: Secondary | ICD-10-CM

## 2018-12-04 DIAGNOSIS — R5383 Other fatigue: Secondary | ICD-10-CM | POA: Diagnosis not present

## 2018-12-04 DIAGNOSIS — K219 Gastro-esophageal reflux disease without esophagitis: Secondary | ICD-10-CM

## 2018-12-04 DIAGNOSIS — J3489 Other specified disorders of nose and nasal sinuses: Secondary | ICD-10-CM

## 2018-12-04 DIAGNOSIS — E669 Obesity, unspecified: Secondary | ICD-10-CM | POA: Diagnosis not present

## 2018-12-04 DIAGNOSIS — Z5181 Encounter for therapeutic drug level monitoring: Secondary | ICD-10-CM

## 2018-12-04 DIAGNOSIS — E039 Hypothyroidism, unspecified: Secondary | ICD-10-CM | POA: Diagnosis not present

## 2018-12-04 DIAGNOSIS — E782 Mixed hyperlipidemia: Secondary | ICD-10-CM | POA: Diagnosis not present

## 2018-12-04 DIAGNOSIS — J0100 Acute maxillary sinusitis, unspecified: Secondary | ICD-10-CM

## 2018-12-04 DIAGNOSIS — F330 Major depressive disorder, recurrent, mild: Secondary | ICD-10-CM

## 2018-12-04 DIAGNOSIS — R413 Other amnesia: Secondary | ICD-10-CM

## 2018-12-04 DIAGNOSIS — Z79890 Hormone replacement therapy (postmenopausal): Secondary | ICD-10-CM

## 2018-12-04 DIAGNOSIS — E66811 Obesity, class 1: Secondary | ICD-10-CM

## 2018-12-04 DIAGNOSIS — R1013 Epigastric pain: Secondary | ICD-10-CM

## 2018-12-04 LAB — LIPID PANEL
Cholesterol: 158 mg/dL (ref <200)
HDL: 67 mg/dL (ref >=50)
LDL Cholesterol (Calc): 71 mg/dL (calc)
Non-HDL Cholesterol (Calc): 91 mg/dL (calc) (ref <130)
Total CHOL/HDL Ratio: 2.4 (calc) (ref <5.0)
Triglycerides: 115 mg/dL (ref <150)

## 2018-12-04 LAB — POCT URINALYSIS DIPSTICK
Bilirubin, UA: NEGATIVE
Blood, UA: NEGATIVE
Glucose, UA: NEGATIVE
Ketones, UA: NEGATIVE
Leukocytes, UA: NEGATIVE
Nitrite, UA: NEGATIVE
Protein, UA: NEGATIVE
Spec Grav, UA: <=1.005 — AB (ref 1.010–1.025)
Urobilinogen, UA: NEGATIVE E.U./dL — AB
pH, UA: 8 (ref 5.0–8.0)

## 2018-12-04 LAB — CBC WITH DIFFERENTIAL/PLATELET
Absolute Monocytes: 569 cells/uL (ref 200–950)
Basophils Absolute: 73 cells/uL (ref 0–200)
Basophils Relative: 1 %
Eosinophils Absolute: 358 cells/uL (ref 15–500)
Eosinophils Relative: 4.9 %
HCT: 34.5 % — ABNORMAL LOW (ref 35.0–45.0)
Hemoglobin: 11.7 g/dL (ref 11.7–15.5)
Lymphs Abs: 2059 cells/uL (ref 850–3900)
MCH: 32.3 pg (ref 27.0–33.0)
MCHC: 33.9 g/dL (ref 32.0–36.0)
MCV: 95.3 fL (ref 80.0–100.0)
MPV: 9.8 fL (ref 7.5–12.5)
Monocytes Relative: 7.8 %
Neutro Abs: 4241 cells/uL (ref 1500–7800)
Neutrophils Relative %: 58.1 %
Platelets: 354 10*3/uL (ref 140–400)
RBC: 3.62 10*6/uL — ABNORMAL LOW (ref 3.80–5.10)
RDW: 11.7 % (ref 11.0–15.0)
Total Lymphocyte: 28.2 %
WBC: 7.3 10*3/uL (ref 3.8–10.8)

## 2018-12-04 LAB — COMPLETE METABOLIC PANEL WITH GFR
AG Ratio: 1.8 (calc) (ref 1.0–2.5)
ALT: 14 U/L (ref 6–29)
AST: 16 U/L (ref 10–35)
Albumin: 4.4 g/dL (ref 3.6–5.1)
Alkaline phosphatase (APISO): 45 U/L (ref 37–153)
BUN: 13 mg/dL (ref 7–25)
CO2: 27 mmol/L (ref 20–32)
Calcium: 9.2 mg/dL (ref 8.6–10.4)
Chloride: 104 mmol/L (ref 98–110)
Creat: 0.88 mg/dL (ref 0.50–0.99)
GFR, Est African American: 79 mL/min/{1.73_m2} (ref >=60)
GFR, Est Non African American: 68 mL/min/{1.73_m2} (ref >=60)
Globulin: 2.5 g/dL (calc) (ref 1.9–3.7)
Glucose, Bld: 87 mg/dL (ref 65–99)
Potassium: 4.6 mmol/L (ref 3.5–5.3)
Sodium: 136 mmol/L (ref 135–146)
Total Bilirubin: 0.5 mg/dL (ref 0.2–1.2)
Total Protein: 6.9 g/dL (ref 6.1–8.1)

## 2018-12-04 LAB — T4, FREE: Free T4: 1.2 ng/dL (ref 0.8–1.8)

## 2018-12-04 LAB — TSH: TSH: 1.43 mIU/L (ref 0.40–4.50)

## 2018-12-04 MED ORDER — BUPROPION HCL ER (XL) 300 MG PO TB24: 300.0000 mg | ORAL_TABLET | Freq: Every day | ORAL | 1 refills | Status: DC

## 2018-12-04 MED ORDER — TIROSINT 88 MCG PO CAPS: 1.0000 | ORAL_CAPSULE | Freq: Every day | ORAL | 1 refills | Status: DC

## 2018-12-04 MED ORDER — ESTRADIOL 0.5 MG PO TABS: 0.5000 mg | ORAL_TABLET | ORAL | 1 refills | Status: DC

## 2018-12-04 MED ORDER — MUPIROCIN 2 % EX OINT: 1.0000 "application " | TOPICAL_OINTMENT | Freq: Two times a day (BID) | CUTANEOUS | 0 refills | Status: AC

## 2018-12-04 MED ORDER — PRAVASTATIN SODIUM 20 MG PO TABS: 20.0000 mg | ORAL_TABLET | Freq: Every day | ORAL | 3 refills | Status: DC

## 2018-12-04 MED ORDER — PANTOPRAZOLE SODIUM 20 MG PO TBEC: 20.0000 mg | DELAYED_RELEASE_TABLET | Freq: Every day | ORAL | 2 refills | Status: DC

## 2018-12-04 MED ORDER — AMOXICILLIN-POT CLAVULANATE 875-125 MG PO TABS: 1.0000 | ORAL_TABLET | Freq: Two times a day (BID) | ORAL | 0 refills | Status: AC

## 2018-12-04 MED ORDER — ESCITALOPRAM OXALATE 10 MG PO TABS: 10.0000 mg | ORAL_TABLET | Freq: Every day | ORAL | 1 refills | Status: DC

## 2018-12-04 NOTE — Assessment & Plan Note (Addendum)
Recent med changed to pravastatin (4-5 months ago, did not return for f/up labs) Less nightmares than lipitor, pt endorses excellent med compliance, but still some nightmares No myalgias no jaundice Recheck FLP and CMP today Pt wants to get off meds - advised she needs significant lifestyle efforts Discussed at length cholesterol dietary changes and need for exercise and weight loss

## 2018-12-04 NOTE — Patient Instructions (Signed)
See your med list for all the changes  Thyroid meds dose increased, take in morning  Add protonix at night for protecting your stomach   Lets do a 4 month follow up, and if not feeling back to your normal self, please come sooner in the next 4-6 weeks    Fatigue If you have fatigue, you feel tired all the time and have a lack of energy or a lack of motivation. Fatigue may make it difficult to start or complete tasks because of exhaustion. In general, occasional or mild fatigue is often a normal response to activity or life. However, long-lasting (chronic) or extreme fatigue may be a symptom of a medical condition. Follow these instructions at home: General instructions  Watch your fatigue for any changes.  Go to bed and get up at the same time every day.  Avoid fatigue by pacing yourself during the day and getting enough sleep at night.  Maintain a healthy weight. Medicines  Take over-the-counter and prescription medicines only as told by your health care provider.  Take a multivitamin, if told by your health care provider.  Do not use herbal or dietary supplements unless they are approved by your health care provider. Activity   Exercise regularly, as told by your health care provider.  Use or practice techniques to help you relax, such as yoga, tai chi, meditation, or massage therapy. Eating and drinking   Avoid heavy meals in the evening.  Eat a well-balanced diet, which includes lean proteins, whole grains, plenty of fruits and vegetables, and low-fat dairy products.  Avoid consuming too much caffeine.  Avoid the use of alcohol.  Drink enough fluid to keep your urine pale yellow. Lifestyle  Change situations that cause you stress. Try to keep your work and personal schedule in balance.  Do not use any products that contain nicotine or tobacco, such as cigarettes and e-cigarettes. If you need help quitting, ask your health care provider.  Do not use drugs.  Contact a health care provider if:  Your fatigue does not get better.  You have a fever.  You suddenly lose or gain weight.  You have headaches.  You have trouble falling asleep or sleeping through the night.  You feel angry, guilty, anxious, or sad.  You are unable to have a bowel movement (constipation).  Your skin is dry.  You have swelling in your legs or another part of your body. Get help right away if:  You feel confused.  Your vision is blurry.  You feel faint or you pass out.  You have a severe headache.  You have severe pain in your abdomen, your back, or the area between your waist and hips (pelvis).  You have chest pain, shortness of breath, or an irregular or fast heartbeat.  You are unable to urinate, or you urinate less than normal.  You have abnormal bleeding, such as bleeding from the rectum, vagina, nose, lungs, or nipples.  You vomit blood.  You have thoughts about hurting yourself or others. If you ever feel like you may hurt yourself or others, or have thoughts about taking your own life, get help right away. You can go to your nearest emergency department or call:  Your local emergency services (911 in the [place]  A suicide crisis helpline, such as the [facility] at (218) 147-5209. This is open 24 hours a day. Summary  If you have fatigue, you feel tired all the time and have a lack of energy  or a lack of motivation.  Fatigue may make it difficult to start or complete tasks because of exhaustion.  Long-lasting (chronic) or extreme fatigue may be a symptom of a medical condition.  Exercise regularly, as told by your health care provider.  Change situations that cause you stress. Try to keep your work and personal schedule in balance. This information is not intended to replace advice given to you by your health care provider. Make sure you discuss any questions you have with your health care provider. Document  Released: 01/30/2007 Document Revised: 07/26/2018 Document Reviewed: 12/28/2016 [employer] Patient Education  2020 [employer].

## 2018-12-04 NOTE — Progress Notes (Signed)
Name: Annette Coleman   MRN: [id number]    DOB: 1950/10/19   Date:12/04/2018       Progress Note  Chief Complaint  Patient presents with   Hypothyroidism    fatigue, check labs   Fatigue   Hyperlipidemia     Subjective:   Annette Coleman is a 68 y.o. female, presents to clinic with CC of fatigue and is due for routine f/up on HLD, hypothyroid, MDD?/depression, and weight.  Acute Issues:  Fatigue - Fatigue gradually began a few months ago and gradually worsened.  Occurs daily.  She is sleepy, has no drive to do anything - she has no desire to go to work or do things she enjoys, such as she loves gardening but doesn't even want to do that now, and shes just "blah".  She is getting enough sleep and laughs saying that she probably is sleeping too much.  She will go to bed at 10 to 10:30 at night and sleep and sometimes until 9 and some days will take a nap.  She has gained some weight, per chart 8 lbs, also feels bloated all over like she ate a lot of salt but she does not have any swelling to her extremities.  She has cold intolerance esp in her extremities-her feet are always cold and she cannot get them warmed up.  She also endorses associated shes constipation and abdominal bloating and she has had to use stool softeners and frequently strains to have a bowel movement.  She also reports just feeling mentally "foggy" and dull, very forgetful.  Her memory concerns are not new she had this prior to 6 months ago and she was referred to neurology but has not yet been able to see them due to [place] pandemic and rescheduling of her appointments.  She describes this as extremely forgetful with to walk from one room to another and forget what she was doing.  She is still able to function at home and at work, still able to manage her medications, refills, finances.  Not getting lost when going places, denies any headaches vision changes, numbness, weakness, tingling, facial droop, slurred speech.  She  also denies associated hair change or hair loss, skin changes.   Her synthroid was changed a few months ago to 88 mcg to 75 mcg, sx do seem to cooincide with her dose change earlier this year.   She denies any CP, SOB, orthopnea, LE edema, palpitations, pallor, melena, hematochezia.  Nose sore/infection -  For several weeks she has had a sore spot to her left nostril is a little red tender to the tip of her nose also feels like there is a cut and there is very sore on the inside with some bleeding.  She does not know when it started and does not know if there was a scratch or injury.  She has not tried anything for it.  Sinus/facial pain/dental pain -  About the time that her nose started hurting she also had some sinus pain and pressure some ear symptoms and some dizziness associated with that that would come and go although she was seen at an urgent care or a minute clinic and she was given Mobic?  She endorses mild to moderate pain to her left upper gums where she has multiple missing molars she has pain to her left maxillary sinus that wraps around her temple and to her left forehead feels like it is all around her eye.  Is worse with palpation.  She has not had any nasal congestion, nasal discharge, sore throat.  Has been ongoing for several weeks.  Abdominal pain/indigestion -  With some of her abdominal bloating and constipation briefly mentioned above she points to her epigastric area endorsing fairly constant burning, aching and soreness which does not radiate this is also been going on for several weeks.  She does not feel like she has any reflux or feel any acid coming up to her throat but she definitely has some bloating belching, indigestion, decreased appetite.  She has been trying over-the-counter Alka-Seltzer without any improvement.  Appetite - sometimes "doesn't care if shes eating or not" but she denies associated nausea, vomiting, diarrhea, flank pain, vaginal symptoms, urinary symptoms  although she wants her urine checked today "just to make sure" she denies hematuria, urinary frequency, urinary urgency, nocturia  Chronic Conditions:   Hypothyroid: Last office visit was in February, her brand levothyroxine - Tirosint, was changed from 88 mcg to 75 mcg after TSH was low.  It was then recheck in April and was WNL, but that is about when pts sx developed - as noted above regarding fatigue, apathy, weight changes, constipation abdominal and GI upset, increased sleeping, swelling and bloated feelings and cold intolerance Lab Results  Component Value Date   TSH 0.75 08/16/2018  Sleep - sleeping probably too much Lack of interest and drive. Patient was on generic levothyroxine before but the name brand works much better for her.  She does like to get 30-day supplies of this medication. Administration, she does take it first in the morning on empty stomach and does not eat or take any other medications for more than 30 minutes afterwards.  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  PHQ screening was done today as follows: Depression screen [facility] 2/9 12/04/2018 06/13/2018 05/23/2018  Decreased Interest 1 0 2  Down, Depressed, Hopeless 0 0 2  PHQ - 2 Score 1 0 4  Altered sleeping 0 0 0  Tired, decreased energy 3 0 2  Change in appetite 1 0 0  Feeling bad or failure about yourself  0 0 0  Trouble concentrating 3 0 1  Moving slowly or fidgety/restless 0 0 0  Suicidal thoughts 0 0 0  PHQ-9 Score 8 0 7  Difficult doing work/chores Somewhat difficult Not difficult at all Not difficult at all  Patient feels like she is getting good sleep may be sleeping a little bit too much.  Her appetite is down and her energy is down.   She does not feel feelings of guilt does not feel down about herself and denies suicidal thoughts, homicidal thoughts or auditory or visual hallucinations.  She does not feel anxious just very apathetic.    Hyperlipidemia: Onset > 05/2018 per chart review, no labs available in EMR prior to 2019 Patient was put on Lipitor 10 mg initially but she had very vivid nightmares every night when she took it so she was changed to pravastatin 20 mg Patient reports that she is compliant with medication and is taking nightly.  She does still have nightmares some night, but less than lipitor, no myalgias or jaundice. Diet - she does not eat fried food, has been supplementing with  flax seed, no efforts with avoiding sweets or reducing calories Exercise - not at all  Obesity/weight gain: BMI > 30 today with 8 lbs weight gain Diet and exercise per above She doesn't understand why she has not been eating as much with some of her GI sx and decreased appetite, but is gradually gaining weight over the past 6 months    Patient Active Problem List   Diagnosis Date Noted   Gastroesophageal reflux disease 12/04/2018   Mild episode of recurrent major depressive disorder ([place]) 12/04/2018   Mixed hyperlipidemia 06/13/2018   Memory impairment 06/13/2018   Obesity (BMI 30.0-34.9) 05/23/2018   Hypothyroidism 05/23/2018    Past Surgical History:  Procedure Laterality Date   ABDOMINAL HYSTERECTOMY     APPENDECTOMY     BUNIONECTOMY     CHOLECYSTECTOMY     complete parotidectomy     ESOPHAGOGASTRODUODENOSCOPY     jaw arthroscopy     TUBAL LIGATION      Family History  Problem Relation Age of Onset   Stroke Mother    Hypertension Mother    Prostate cancer Father    Cancer Sister    Hypertension Sister     Social History   Socioeconomic History   Marital status: Married    Spouse name: Shirlean Mylar   Number of children: 2   Years of education: 12   Highest education level: Associate  degree: occupational, Hotel manager, or vocational program  Occupational History   Not on file  Social Needs   Financial resource strain: Not hard at all   Food insecurity    Worry: Never true    Inability: Never true   Transportation needs    Medical: No    Non-medical: No  Tobacco Use   Smoking status: Former Smoker    Quit date: 2012    Years since quitting: 8.6   Smokeless tobacco: Never Used  Substance and Sexual Activity   Alcohol use: Yes    Comment: rare   Drug use: Not Currently   Sexual activity: Not Currently  Lifestyle   Physical activity    Days per week: 0 days    Minutes per session: 0 min   Stress: Not at all  Relationships   Social connections    Talks on phone: Never    Gets together: Never    Attends religious service: Never    Active member of club or organization: No    Attends meetings of clubs or organizations: Never    Relationship status: Married   Intimate partner violence    Fear of current or ex partner: No    Emotionally abused: No    Physically abused: No    Forced sexual activity: No  Other Topics Concern   Not on file  Social History Narrative   Not on file     Current Outpatient Medications:    baclofen (LIORESAL) 10 MG tablet, Take 1 tablet (10 mg total) by mouth daily., Disp: 30 tablet, Rfl: 0   buPROPion (WELLBUTRIN XL) 300 MG 24 hr tablet, TAKE 1 TABLET BY MOUTH EVERY DAY, Disp: 90 tablet, Rfl: 0   cholecalciferol (VITAMIN D3) 25 MCG (1000 UT) tablet, Take 1,000 Units by mouth daily., Disp: , Rfl:    cyanocobalamin 1000 MCG tablet, Take 1,000 mcg by mouth daily., Disp: , Rfl:    ELDERBERRY PO, Take 1 tablet by mouth daily., Disp: , Rfl:    escitalopram (LEXAPRO) 10 MG tablet, Take 1 tablet (10 mg total) by mouth  daily., Disp: 90 tablet, Rfl: 1   estradiol (ESTRACE) 0.5 MG tablet, TAKE 1 TABLET BY MOUTH EVERY DAY, Disp: 90 tablet, Rfl: 0   fluocinonide cream (LIDEX) 4.19 %, Apply 1 application topically 2  (two) times daily., Disp: , Rfl:    Levothyroxine Sodium (TIROSINT) 75 MCG CAPS, Take 1 capsule (75 mcg total) by mouth daily before breakfast., Disp: 90 capsule, Rfl: 0   lidocaine (LIDODERM) 5 %, Place 1 patch onto the skin daily. Remove & Discard patch within 12 hours or as directed by MD, Disp: 30 patch, Rfl: 0   meloxicam (MOBIC) 7.5 MG tablet, Take 1 tablet (7.5 mg total) by mouth daily., Disp: 10 tablet, Rfl: 0   NON FORMULARY, Take 1 tablet by mouth as needed. arthriten, Disp: , Rfl:    pravastatin (PRAVACHOL) 20 MG tablet, Take 1 tablet (20 mg total) by mouth at bedtime. For cholesterol, Disp: 90 tablet, Rfl: 1  No Known Allergies  I personally reviewed active problem list, medication list, allergies, family history, social history, health maintenance, notes from last encounter, lab results with the patient/caregiver today.  Review of Systems  Constitutional: Negative.  Negative for chills, diaphoresis, fever, malaise/fatigue and weight loss.  HENT: Positive for congestion, ear pain and sinus pain. Negative for ear discharge, hearing loss, nosebleeds, sore throat and tinnitus.   Eyes: Negative.  Negative for blurred vision, double vision, photophobia, pain, discharge and redness.  Respiratory: Negative.  Negative for cough, hemoptysis, sputum production, shortness of breath, wheezing and stridor.   Cardiovascular: Negative.  Negative for chest pain, palpitations, orthopnea, claudication, leg swelling and PND.  Gastrointestinal: Positive for abdominal pain, constipation and heartburn (indigestion, epigastric burning). Negative for blood in stool, diarrhea, melena, nausea and vomiting.  Genitourinary: Negative.  Negative for dysuria, frequency, hematuria and urgency.  Musculoskeletal: Negative.  Negative for back pain, joint pain, myalgias and neck pain.  Skin: Negative.  Negative for itching and rash.  Neurological: Negative.  Negative for dizziness, tingling, tremors, sensory  change, speech change, focal weakness, loss of consciousness, weakness and headaches.  Endo/Heme/Allergies: Negative.  Negative for environmental allergies and polydipsia. Does not bruise/bleed easily.  Psychiatric/Behavioral: Positive for memory loss. Negative for substance abuse and suicidal ideas. The patient is not nervous/anxious and does not have insomnia.      Objective:   Vitals:   12/04/18 1048  BP: 130/84  Pulse: 72  Resp: 16  Temp: 97.8 F (36.6 C)  TempSrc: Oral  SpO2: 98%  Weight: 173 lb 12.8 oz (78.8 kg)  Height: 5\' 3"  (1.6 m)    Body mass index is 30.79 kg/m.  Physical Exam Vitals signs and nursing note reviewed.  Constitutional:      General: She is not in acute distress.    Appearance: Normal appearance. She is well-developed. She is obese. She is not ill-appearing, toxic-appearing or diaphoretic.  HENT:     Head: Normocephalic and atraumatic.     Right Ear: Hearing, ear canal and external ear normal. No swelling or tenderness. There is no impacted cerumen. No mastoid tenderness.     Left Ear: Hearing, ear canal and external ear normal. No swelling or tenderness. There is no impacted cerumen. No mastoid tenderness.     Ears:     Comments: B/L TMs dull, retracted, no purulence, effusion or erythema    Nose: Nasal deformity, mucosal edema and congestion present. No rhinorrhea.     Right Sinus: No maxillary sinus tenderness or frontal sinus tenderness.  Left Sinus: Maxillary sinus tenderness present. No frontal sinus tenderness.     Comments: Left nare with visible blood, some edema and erythema, ttp, nose with some visible edema and erythema as well, also ttp    Mouth/Throat:     Lips: Pink.     Mouth: Mucous membranes are moist.     Pharynx: Oropharynx is clear. Uvula midline. No pharyngeal swelling, oropharyngeal exudate, posterior oropharyngeal erythema or uvula swelling.     Comments: No intraoral pallor ttp to left upper molar gum line - multiple  missing molars Eyes:     General: Lids are normal. No scleral icterus.       Right eye: No discharge.        Left eye: No discharge.     Extraocular Movements: Extraocular movements intact.     Conjunctiva/sclera: Conjunctivae normal.     Pupils: Pupils are equal, round, and reactive to light.  Neck:     Musculoskeletal: Normal range of motion and neck supple.     Trachea: Phonation normal. No tracheal deviation.  Cardiovascular:     Rate and Rhythm: Normal rate and regular rhythm.     Pulses: Normal pulses.          Radial pulses are 2+ on the right side and 2+ on the left side.       Posterior tibial pulses are 2+ on the right side and 2+ on the left side.     Heart sounds: Normal heart sounds. No murmur. No friction rub. No gallop.   Pulmonary:     Effort: Pulmonary effort is normal. No respiratory distress.     Breath sounds: Normal breath sounds. No stridor. No wheezing, rhonchi or rales.  Chest:     Chest wall: No tenderness.  Abdominal:     General: Bowel sounds are normal. There is no distension.     Palpations: Abdomen is soft.     Tenderness: There is no abdominal tenderness. There is no guarding or rebound.     Hernia: No hernia is present.  Musculoskeletal: Normal range of motion.        General: No deformity.     Right lower leg: No edema.     Left lower leg: No edema.  Lymphadenopathy:     Head:     Right side of head: No submental, tonsillar, preauricular or posterior auricular adenopathy.     Left side of head: No submental, submandibular, tonsillar, preauricular or posterior auricular adenopathy.     Cervical: No cervical adenopathy.     Right cervical: No superficial, deep or posterior cervical adenopathy.    Left cervical: No superficial, deep or posterior cervical adenopathy.  Skin:    General: Skin is warm and dry.     Capillary Refill: Capillary refill takes less than 2 seconds.     Coloration: Skin is not jaundiced or pale.     Findings: No bruising or  rash.  Neurological:     Mental Status: She is alert and oriented to person, place, and time.     Cranial Nerves: No cranial nerve deficit.     Sensory: No sensory deficit.     Motor: No weakness or abnormal muscle tone.     Coordination: Coordination normal.     Gait: Gait normal.  Psychiatric:        Attention and Perception: Perception normal. She is inattentive.        Mood and Affect: Mood and affect normal.  Speech: Speech is rapid and pressured. Speech is not delayed or slurred.        Behavior: Behavior is hyperactive. Behavior is not agitated, slowed, aggressive or withdrawn. Behavior is cooperative.        Thought Content: Thought content normal. Thought content does not include homicidal or suicidal ideation. Thought content does not include homicidal or suicidal plan.        Cognition and Memory: Cognition and memory normal.        Judgment: Judgment is not impulsive.     Comments: Often interrupts or changes subjects abruptly when conversing Talkative, pleasant Slightly hyperactive       PHQ2/9: Depression screen [facility] 2/9 12/04/2018 06/13/2018 05/23/2018  Decreased Interest 1 0 2  Down, Depressed, Hopeless 0 0 2  PHQ - 2 Score 1 0 4  Altered sleeping 0 0 0  Tired, decreased energy 3 0 2  Change in appetite 1 0 0  Feeling bad or failure about yourself  0 0 0  Trouble concentrating 3 0 1  Moving slowly or fidgety/restless 0 0 0  Suicidal thoughts 0 0 0  PHQ-9 Score 8 0 7  Difficult doing work/chores Somewhat difficult Not difficult at all Not difficult at all    phq 9 is positive - reviewed sx today - feel more related to chemical hypothyroid than worsening MDD, no SI, HI, AVH  Fall Risk: Fall Risk  12/04/2018 06/13/2018 05/23/2018  Falls in the past year? 0 0 0  Number falls in past yr: 0 - 0  Injury with Fall? 0 - 0  Follow up Falls evaluation completed - -      Assessment & Plan:   Problem List Items Addressed This Visit      Digestive    Gastroesophageal reflux disease    Mild epigastric ttp with dyspepsia Hx of GERD in the past, recurrent not improving with Alka-Seltzer Trial protonix  Decrease NSAID use No ETOH use       Relevant Medications   pantoprazole (PROTONIX) 20 MG tablet     Endocrine   Hypothyroidism    Sx of chemical hypothyroid after dose changed from 88 mcg to 75 mcg a few months ago Recheck TSH, but likely will increase again to 88 mcg, f/up in 6 weeks if sx not improving (including mood, energy, cold intolerance, constipation)      Relevant Medications   Levothyroxine Sodium (TIROSINT) 88 MCG CAPS   Other Relevant Orders   TSH   T4, free     Other   Obesity (BMI 30.0-34.9)    8 lbs weight gain since last visit/weight - possibly due to levothyroxine dose change/chemically hypothyroid Wt Readings from Last 5 Encounters:  12/04/18 173 lb 12.8 oz (78.8 kg)  08/21/18 165 lb (74.8 kg)  06/13/18 167 lb 12.8 oz (76.1 kg)  05/23/18 173 lb 12.8 oz (78.8 kg)  Work on [employer] and exercise and med dosing changed Will recheck weight/bmi at next routine office visit Has HLD as well - wants to be off meds, would need improved diet, weight loss, and start exercising       Relevant Orders   TSH   T4, free   COMPLETE METABOLIC PANEL WITH GFR   Lipid panel   Mixed hyperlipidemia    Recent med changed to pravastatin (4-5 months ago, did not return for f/up labs) Less nightmares than lipitor, pt endorses excellent med compliance, but still some nightmares No myalgias no jaundice Recheck FLP and CMP  today Pt wants to get off meds - advised she needs significant lifestyle efforts Discussed at length cholesterol dietary changes and need for exercise and weight loss        Relevant Medications   pravastatin (PRAVACHOL) 20 MG tablet   Other Relevant Orders   COMPLETE METABOLIC PANEL WITH GFR   Lipid panel   Memory difficulties    Assessed previously by Dr. Sanda Klein Pt has non-focal neuro eval, she  is alert and oriented, answering questions appropriately Referred previously to neuro - pending appt Optimize thyroid management Most sx and complaints are forgetfulness and focus Does not seem to be short or long term memory or executive functioning and no focal neurological deficit      Mild episode of recurrent major depressive disorder ([place])    Pt on lexapro and wellbutrin though doesn't know what they are for MDD in chart hx, moved to active problem list today PHQ positive but no guilt, feelings that shes a failure, no SI, HI, AVH, she just has no motivation, no desire to do anything and it overall fatigued and apathetic - not catatonic - very lively, personable, talkative and energetic in exam room today Will adjust thyroid meds first and monitor before changing lexapro or wellbutrin Encouraged her to start exercising, f/up if not feeling better in the next 4-6 weeks and we will f/up on fatigue/mood      Relevant Medications   escitalopram (LEXAPRO) 10 MG tablet   buPROPion (WELLBUTRIN XL) 300 MG 24 hr tablet   Current long-term use of postmenopausal hormone replacement therapy    Discussed risks of HRT, advised gradual taper down as tolerated Can cut dose in half or take every other day and when tolerating change to 2-3 x per week.       Other Visit Diagnoses    Fatigue, unspecified type    -  Primary   recheck TSH recent dose decrease, r/o anemia, check renal function and electrolytes, hx of Vit D and B12 deficiency last labs good   Relevant Medications   Levothyroxine Sodium (TIROSINT) 88 MCG CAPS   Other Relevant Orders   TSH   T4, free   CBC with Differential/Platelet   COMPLETE METABOLIC PANEL WITH GFR   POCT urinalysis dipstick (Completed)   Medication monitoring encounter       Relevant Orders   TSH   T4, free   CBC with Differential/Platelet   COMPLETE METABOLIC PANEL WITH GFR   Lipid panel   Epigastric abdominal pain       suspect some GERD/gastritis w/o  esophagitis, burning, epigastric pain, decreased appetite, some indigestion, PPI trial - take at night on empty stomach DDx GERD, gastric ulcer, PUD? Sx may also be secondary to constipation sx    Relevant Medications   pantoprazole (PROTONIX) 20 MG tablet   Nasal sore       suspect possible staph infection, topical mupirocin BID x 7 day   Relevant Medications   mupirocin ointment (BACTROBAN) 2 %   amoxicillin-clavulanate (AUGMENTIN) 875-125 MG tablet   Subacute maxillary sinusitis       gradually worsening for several weeks, mild ttp mostly to left upper maxillary gums w/o evidence of dental abscess, can do course of augmentin if it worsens   Relevant Medications   amoxicillin-clavulanate (AUGMENTIN) 875-125 MG tablet   Pain, dental       no abscess, area of pain with molars extracted - possibly sinus disease/infection   Relevant Medications   amoxicillin-clavulanate (AUGMENTIN) 875-125  MG tablet      UA done today per pt request was unremarkable.  Return in about 4 months (around 04/05/2019) for thyroid, HLD, sooner if not feeling better (6 wk).   Pt establishing with new PCP today.  Addressed multiple acute complaints and her chronic conditions Greater than 50% of this visit was spent in direct face-to-face counseling, obtaining history and physical, discussing and educating pt on treatment plan.  Total time of this visit was 50 min.  Remainder of time involved but was not limited to reviewing chart (recent and pertinent OV notes and labs), documentation in EMR, and coordinating care and treatment plan.   Delsa Grana, PA-C 12/04/18 10:53 AM

## 2018-12-04 NOTE — Assessment & Plan Note (Signed)
Mild epigastric ttp with dyspepsia Hx of GERD in the past, recurrent not improving with Alka-Seltzer Trial protonix  Decrease NSAID use No ETOH use

## 2018-12-04 NOTE — Assessment & Plan Note (Signed)
Pt on lexapro and wellbutrin though doesn't know what they are for MDD in chart hx, moved to active problem list today PHQ positive but no guilt, feelings that shes a failure, no SI, HI, AVH, she just has no motivation, no desire to do anything and it overall fatigued and apathetic - not catatonic - very lively, personable, talkative and energetic in exam room today Will adjust thyroid meds first and monitor before changing lexapro or wellbutrin Encouraged her to start exercising, f/up if not feeling better in the next 4-6 weeks and we will f/up on fatigue/mood

## 2018-12-04 NOTE — Assessment & Plan Note (Signed)
Hx of, currently supplementing OTC, last labs WNL Will not recheck today, but may if sx continue after thyroid med adjustment

## 2018-12-04 NOTE — Assessment & Plan Note (Signed)
Discussed risks of HRT, advised gradual taper down as tolerated Can cut dose in half or take every other day and when tolerating change to 2-3 x per week.

## 2018-12-04 NOTE — Assessment & Plan Note (Addendum)
Sx of chemical hypothyroid after dose changed from 88 mcg to 75 mcg a few months ago Recheck TSH, but likely will increase again to 88 mcg, f/up in 6 weeks if sx not improving (including mood, energy, cold intolerance, constipation)

## 2018-12-04 NOTE — Assessment & Plan Note (Addendum)
8 lbs weight gain since last visit/weight - possibly due to levothyroxine dose change/chemically hypothyroid Wt Readings from Last 5 Encounters:  12/04/18 173 lb 12.8 oz (78.8 kg)  08/21/18 165 lb (74.8 kg)  06/13/18 167 lb 12.8 oz (76.1 kg)  05/23/18 173 lb 12.8 oz (78.8 kg)  Work on [employer] and exercise and med dosing changed Will recheck weight/bmi at next routine office visit Has HLD as well - wants to be off meds, would need improved diet, weight loss, and start exercising

## 2018-12-04 NOTE — Assessment & Plan Note (Signed)
Assessed previously by Dr. Sanda Klein Pt has non-focal neuro eval, she is alert and oriented, answering questions appropriately Referred previously to neuro - pending appt Optimize thyroid management Most sx and complaints are forgetfulness and focus Does not seem to be short or long term memory or executive functioning and no focal neurological deficit

## 2018-12-05 ENCOUNTER — Other Ambulatory Visit: Payer: Self-pay | Admitting: Family Medicine

## 2018-12-12 ENCOUNTER — Ambulatory Visit: Payer: Managed Care, Other (non HMO) | Admitting: Family Medicine

## 2018-12-26 DIAGNOSIS — R413 Other amnesia: Secondary | ICD-10-CM | POA: Insufficient documentation

## 2019-01-01 ENCOUNTER — Other Ambulatory Visit: Payer: Self-pay

## 2019-01-01 ENCOUNTER — Other Ambulatory Visit: Payer: Self-pay | Admitting: Family Medicine

## 2019-01-01 ENCOUNTER — Other Ambulatory Visit
Admission: RE | Admit: 2019-01-01 | Discharge: 2019-01-01 | Disposition: A | Discharge status: Home / Self Care | Payer: Managed Care, Other (non HMO) | Source: Ambulatory Visit | Attending: Gastroenterology | Admitting: Gastroenterology

## 2019-01-01 DIAGNOSIS — Z01812 Encounter for preprocedural laboratory examination: Secondary | ICD-10-CM | POA: Diagnosis present

## 2019-01-01 DIAGNOSIS — Z20828 Contact with and (suspected) exposure to other viral communicable diseases: Secondary | ICD-10-CM | POA: Insufficient documentation

## 2019-01-01 DIAGNOSIS — E538 Deficiency of other specified B group vitamins: Secondary | ICD-10-CM

## 2019-01-02 LAB — SARS CORONAVIRUS 2 (TAT 6-24 HRS): SARS Coronavirus 2: NEGATIVE

## 2019-01-02 LAB — CBC WITH DIFFERENTIAL/PLATELET
Absolute Monocytes: 578 cells/uL (ref 200–950)
Basophils Absolute: 77 cells/uL (ref 0–200)
Basophils Relative: 1.4 %
Eosinophils Absolute: 446 cells/uL (ref 15–500)
Eosinophils Relative: 8.1 %
HCT: 33.7 % — ABNORMAL LOW (ref 35.0–45.0)
Hemoglobin: 11.3 g/dL — ABNORMAL LOW (ref 11.7–15.5)
Lymphs Abs: 1590 cells/uL (ref 850–3900)
MCH: 32.2 pg (ref 27.0–33.0)
MCHC: 33.5 g/dL (ref 32.0–36.0)
MCV: 96 fL (ref 80.0–100.0)
MPV: 9.7 fL (ref 7.5–12.5)
Monocytes Relative: 10.5 %
Neutro Abs: 2811 cells/uL (ref 1500–7800)
Neutrophils Relative %: 51.1 %
Platelets: 346 10*3/uL (ref 140–400)
RBC: 3.51 10*6/uL — ABNORMAL LOW (ref 3.80–5.10)
RDW: 11.6 % (ref 11.0–15.0)
Total Lymphocyte: 28.9 %
WBC: 5.5 10*3/uL (ref 3.8–10.8)

## 2019-01-02 LAB — COMPLETE METABOLIC PANEL WITH GFR
AG Ratio: 1.6 (calc) (ref 1.0–2.5)
ALT: 12 U/L (ref 6–29)
AST: 16 U/L (ref 10–35)
Albumin: 4.1 g/dL (ref 3.6–5.1)
Alkaline phosphatase (APISO): 43 U/L (ref 37–153)
BUN: 14 mg/dL (ref 7–25)
CO2: 27 mmol/L (ref 20–32)
Calcium: 9.4 mg/dL (ref 8.6–10.4)
Chloride: 105 mmol/L (ref 98–110)
Creat: 0.93 mg/dL (ref 0.50–0.99)
GFR, Est African American: 74 mL/min/{1.73_m2} (ref >=60)
GFR, Est Non African American: 64 mL/min/{1.73_m2} (ref >=60)
Globulin: 2.5 g/dL (calc) (ref 1.9–3.7)
Glucose, Bld: 96 mg/dL (ref 65–99)
Potassium: 4.7 mmol/L (ref 3.5–5.3)
Sodium: 138 mmol/L (ref 135–146)
Total Bilirubin: 0.4 mg/dL (ref 0.2–1.2)
Total Protein: 6.6 g/dL (ref 6.1–8.1)

## 2019-01-02 LAB — LIPID PANEL
Cholesterol: 137 mg/dL (ref <200)
HDL: 58 mg/dL (ref >=50)
LDL Cholesterol (Calc): 63 mg/dL (calc)
Non-HDL Cholesterol (Calc): 79 mg/dL (calc) (ref <130)
Total CHOL/HDL Ratio: 2.4 (calc) (ref <5.0)
Triglycerides: 84 mg/dL (ref <150)

## 2019-01-02 LAB — VITAMIN B12: Vitamin B-12: 971 pg/mL (ref 200–1100)

## 2019-01-02 LAB — T4, FREE: Free T4: 1.3 ng/dL (ref 0.8–1.8)

## 2019-01-02 LAB — TSH: TSH: 0.33 mIU/L — ABNORMAL LOW (ref 0.40–4.50)

## 2019-01-04 ENCOUNTER — Ambulatory Visit
Admission: RE | Admit: 2019-01-04 | Discharge: 2019-01-04 | Disposition: A | Discharge status: Home / Self Care | Payer: Managed Care, Other (non HMO) | Attending: Gastroenterology | Admitting: Gastroenterology

## 2019-01-04 ENCOUNTER — Encounter: Payer: Self-pay | Admitting: Anesthesiology

## 2019-01-04 ENCOUNTER — Ambulatory Visit: Payer: Managed Care, Other (non HMO) | Admitting: Registered Nurse

## 2019-01-04 ENCOUNTER — Encounter
Admission: RE | Disposition: A | Discharge status: Home / Self Care | Payer: Self-pay | Source: Home / Self Care | Attending: Gastroenterology

## 2019-01-04 ENCOUNTER — Other Ambulatory Visit: Payer: Self-pay

## 2019-01-04 DIAGNOSIS — E079 Disorder of thyroid, unspecified: Secondary | ICD-10-CM | POA: Insufficient documentation

## 2019-01-04 DIAGNOSIS — Z860100 Personal history of colon polyps, unspecified: Secondary | ICD-10-CM

## 2019-01-04 DIAGNOSIS — Z79899 Other long term (current) drug therapy: Secondary | ICD-10-CM | POA: Insufficient documentation

## 2019-01-04 DIAGNOSIS — D120 Benign neoplasm of cecum: Secondary | ICD-10-CM | POA: Diagnosis not present

## 2019-01-04 DIAGNOSIS — F329 Major depressive disorder, single episode, unspecified: Secondary | ICD-10-CM | POA: Diagnosis not present

## 2019-01-04 DIAGNOSIS — K635 Polyp of colon: Secondary | ICD-10-CM

## 2019-01-04 DIAGNOSIS — I872 Venous insufficiency (chronic) (peripheral): Secondary | ICD-10-CM | POA: Diagnosis not present

## 2019-01-04 DIAGNOSIS — K648 Other hemorrhoids: Secondary | ICD-10-CM | POA: Insufficient documentation

## 2019-01-04 DIAGNOSIS — K5730 Diverticulosis of large intestine without perforation or abscess without bleeding: Secondary | ICD-10-CM | POA: Diagnosis not present

## 2019-01-04 DIAGNOSIS — K219 Gastro-esophageal reflux disease without esophagitis: Secondary | ICD-10-CM | POA: Diagnosis not present

## 2019-01-04 DIAGNOSIS — Z79890 Hormone replacement therapy (postmenopausal): Secondary | ICD-10-CM | POA: Diagnosis not present

## 2019-01-04 DIAGNOSIS — Z87891 Personal history of nicotine dependence: Secondary | ICD-10-CM | POA: Insufficient documentation

## 2019-01-04 DIAGNOSIS — Z1211 Encounter for screening for malignant neoplasm of colon: Secondary | ICD-10-CM | POA: Insufficient documentation

## 2019-01-04 HISTORY — PX: COLONOSCOPY WITH PROPOFOL: SHX5780

## 2019-01-04 SURGERY — COLONOSCOPY WITH PROPOFOL
Anesthesia: General

## 2019-01-04 MED ORDER — PROPOFOL 500 MG/50ML IV EMUL
INTRAVENOUS | Status: AC
  Filled 2019-01-04: qty 50

## 2019-01-04 MED ORDER — PROPOFOL 500 MG/50ML IV EMUL
INTRAVENOUS | Status: DC | PRN
  Administered 2019-01-04: 150 ug/kg/min via INTRAVENOUS

## 2019-01-04 MED ORDER — SODIUM CHLORIDE 0.9 % IV SOLN
INTRAVENOUS | Status: DC
  Administered 2019-01-04: 08:00:00 1000 mL via INTRAVENOUS

## 2019-01-04 MED ORDER — LIDOCAINE HCL (PF) 2 % IJ SOLN
INTRAMUSCULAR | Status: DC | PRN
  Administered 2019-01-04: 60 mg via INTRADERMAL

## 2019-01-04 MED ORDER — PROPOFOL 10 MG/ML IV BOLUS
INTRAVENOUS | Status: AC
  Filled 2019-01-04: qty 20

## 2019-01-04 MED ORDER — LIDOCAINE HCL (PF) 2 % IJ SOLN
INTRAMUSCULAR | Status: AC
  Filled 2019-01-04: qty 10

## 2019-01-04 MED ORDER — PROPOFOL 10 MG/ML IV BOLUS
INTRAVENOUS | Status: DC | PRN
  Administered 2019-01-04: 20 mg via INTRAVENOUS
  Administered 2019-01-04: 60 mg via INTRAVENOUS
  Administered 2019-01-04 (×2): 20 mg via INTRAVENOUS

## 2019-01-04 NOTE — Anesthesia Post-op Follow-up Note (Signed)
Anesthesia QCDR form completed.        

## 2019-01-04 NOTE — Op Note (Signed)
[facility] Patient Name: Annette Coleman Procedure Date: 01/04/2019 7:45 AM MRN: [id number] Account #: [id number] Date of Birth: Nov 27, 1950 Admit Type: Outpatient Age: 68 Room: [facility] ENDO ROOM 2 Gender: Female Note Status: Finalized Procedure:            Colonoscopy Indications:          High risk colon cancer surveillance: Personal history                        of colonic polyps Providers:             B. Bonna Gains MD, MD Referring MD:         Delsa Grana (Referring MD) Medicines:            Monitored Anesthesia Care Complications:        No immediate complications. Procedure:            Pre-Anesthesia Assessment:                       - ASA Grade Assessment: II - A patient with mild                        systemic disease.                       - Prior to the procedure, a History and Physical was                        performed, and patient medications, allergies and                        sensitivities were reviewed. The patient's tolerance of                        previous anesthesia was reviewed.                       - The risks and benefits of the procedure and the                        sedation options and risks were discussed with the                        patient. All questions were answered and informed                        consent was obtained.                       - Patient identification and proposed procedure were                        verified prior to the procedure by the physician, the                        nurse, the anesthesiologist, the anesthetist and the                        technician. The procedure was verified in the procedure  room.                       After obtaining informed consent, the colonoscope was                        passed under direct vision. Throughout the procedure,                        the patient's blood pressure, pulse, and oxygen   saturations were monitored continuously. The                        Colonoscope was introduced through the anus and                        advanced to the the cecum, identified by appendiceal                        orifice and ileocecal valve. The colonoscopy was                        performed with ease. The patient tolerated the                        procedure well. The quality of the bowel preparation                        was good. Findings:      The perianal and digital rectal examinations were normal.      Two sessile polyps were found in the cecum. The polyps were 3 to 4 mm in       size. These polyps were removed with a cold biopsy forceps. Resection       and retrieval were complete.      Multiple diverticula were found in the sigmoid colon.      The exam was otherwise without abnormality.      The rectum, sigmoid colon, descending colon, transverse colon, ascending       colon and cecum appeared normal.      Non-bleeding internal hemorrhoids were found during endoscopy.      The retroflexed view of the distal rectum and anal verge was normal and       showed no anal or rectal abnormalities. Impression:           - Two 3 to 4 mm polyps in the cecum, removed with a                        cold biopsy forceps. Resected and retrieved.                       - Diverticulosis in the sigmoid colon.                       - The examination was otherwise normal.                       - The rectum, sigmoid colon, descending colon,                        transverse colon, ascending colon and cecum are normal.                       -  Non-bleeding internal hemorrhoids.                       - The distal rectum and anal verge are normal on                        retroflexion view. Recommendation:       - Discharge patient to home (with escort).                       - High fiber diet.                       - Advance diet as tolerated.                       - Continue present medications.                        - Await pathology results.                       - Repeat colonoscopy in 5 years.                       - The findings and recommendations were discussed with                        the patient.                       - The findings and recommendations were discussed with                        the patient's family.                       - Return to primary care physician as previously                        scheduled.                       - High fiber diet. Procedure Code(s):    --- Professional ---                       [phone], Colonoscopy, flexible; with biopsy, single or                        multiple Diagnosis Code(s):    --- Professional ---                       Z86.010, Personal history of colonic polyps                       K63.5, Polyp of colon                       K57.30, Diverticulosis of large intestine without                        perforation or abscess without bleeding CPT copyright 2019 American Medical Association. All rights reserved. The codes documented in this report are preliminary and upon coder review may  be revised to meet current compliance requirements.  Vonda Antigua, MD Margretta Sidle B. Bonna Gains MD, MD 01/04/2019 9:01:03 AM This report has been signed electronically. Number of Addenda: 0 Note Initiated On: 01/04/2019 7:45 AM Scope Withdrawal Time: 0 hours 15 minutes 25 seconds  Total Procedure Duration: 0 hours 32 minutes 40 seconds  Estimated Blood Loss: Estimated blood loss: none.      [employer]

## 2019-01-04 NOTE — H&P (Signed)
Vonda Antigua, MD [address], [place], [place], [place], [address] [address], [place], [place], [place], [address] Phone: [phone]  Fax: [phone]  Primary Care Physician:  Delsa Grana, PA-C   Pre-Procedure History & Physical: HPI:  Annette Coleman is a 68 y.o. female is here for a colonoscopy.   Past Medical History:  Diagnosis Date  . ADHD   . Anemia   . Anemia 12/04/2017  . 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 Depression   . Elevated blood pressure reading without diagnosis of hypertension   . Hormone replacement therapy   . Memory impairment   . Thyroid disease   . Venous insufficiency of both lower extremities 11/13/2016    Past Surgical History:  Procedure Laterality Date  . ABDOMINAL HYSTERECTOMY    . APPENDECTOMY    . BUNIONECTOMY    . CHOLECYSTECTOMY    . complete parotidectomy    . ESOPHAGOGASTRODUODENOSCOPY    . jaw arthroscopy    . TUBAL LIGATION      Prior to Admission medications   Medication Sig Start Date End Date Taking? Authorizing Provider  buPROPion (WELLBUTRIN XL) 300 MG 24 hr tablet Take 1 tablet (300 mg total) by mouth daily. 12/04/18  Yes Delsa Grana, PA-C  cholecalciferol (VITAMIN D3) 25 MCG (1000 UT) tablet Take 1,000 Units by mouth daily.   Yes [provider]  cyanocobalamin 1000 MCG tablet Take 1,000 mcg by mouth daily.   Yes [provider]  ELDERBERRY PO Take 1 tablet by mouth daily.   Yes [provider]  escitalopram (LEXAPRO) 10 MG tablet Take 1 tablet (10 mg total) by mouth daily. 12/04/18  Yes Delsa Grana, PA-C  estradiol (ESTRACE) 0.5 MG tablet Take 1 tablet (0.5 mg total) by mouth every other day. For 1-2 weeks then decrease to three times a week 12/04/18  Yes Delsa Grana, PA-C  fluocinonide cream (LIDEX) [id number] % Apply 1 application topically 2 (two) times daily.   Yes [provider]  Levothyroxine Sodium (TIROSINT) 88 MCG CAPS  Take 1 capsule (88 mcg total) by mouth daily. 12/04/18  Yes Delsa Grana, PA-C  lidocaine (LIDODERM) 5 % Place 1 patch onto the skin daily. Remove & Discard patch within 12 hours or as directed by MD 08/21/18  Yes Laban Emperor, PA-C  NON FORMULARY Take 1 tablet by mouth as needed. arthriten   Yes [provider]  nortriptyline (PAMELOR) 10 MG capsule Take 20 mg by mouth at bedtime.   Yes [provider]  pantoprazole (PROTONIX) 20 MG tablet Take 1 tablet (20 mg total) by mouth daily. 12/04/18  Yes Delsa Grana, PA-C  pravastatin (PRAVACHOL) 20 MG tablet Take 1 tablet (20 mg total) by mouth at bedtime. For cholesterol 12/04/18  Yes Delsa Grana, PA-C    Allergies as of 11/29/2018  . (No Known Allergies)    Family History  Problem Relation Age of Onset  . Stroke Mother   . Hypertension Mother   . Prostate cancer Father   . Cancer Sister   . Hypertension Sister     Social History   Socioeconomic History  . Marital status: Married    Spouse name: Shirlean Mylar  . Number of children: 2  . Years of education: 37  . Highest education level: Associate degree: occupational, Hotel manager, or vocational program  Occupational History  . Not on file  Social Needs  . Financial resource strain: Not hard at all  . Food insecurity  Worry: Never true    Inability: Never true  . Transportation needs    Medical: No    Non-medical: No  Tobacco Use  . Smoking status: Former Smoker    Quit date: 2012    Years since quitting: 8.7  . Smokeless tobacco: Never Used  Substance and Sexual Activity  . Alcohol use: Yes    Comment: rare  . Drug use: Not Currently  . Sexual activity: Not Currently  Lifestyle  . Physical activity    Days per week: 0 days    Minutes per session: 0 min  . Stress: Not at all  Relationships  . Social Herbalist on phone: Never    Gets together: Never    Attends religious service: Never    Active member of club or organization: No    Attends  meetings of clubs or organizations: Never    Relationship status: Married  . Intimate partner violence    Fear of current or ex partner: No    Emotionally abused: No    Physically abused: No    Forced sexual activity: No  Other Topics Concern  . Not on file  Social History Narrative  . Not on file    Review of Systems: See HPI, otherwise negative ROS  Physical Exam: BP (!) 135/92   Pulse 67   Temp (!) 97.3 F (36.3 C) (Tympanic)   Resp 20   Ht 5\' 3"  (1.6 m)   Wt 80.7 kg   SpO2 97%   BMI 31.53 kg/m  General:   Alert,  pleasant and cooperative in NAD Head:  Normocephalic and atraumatic. Neck:  Supple; no masses or thyromegaly. Lungs:  Clear throughout to auscultation, normal respiratory effort.    Heart:  +S1, +S2, Regular rate and rhythm, No edema. Abdomen:  Soft, nontender and nondistended. Normal bowel sounds, without guarding, and without rebound.   Neurologic:  Alert and  oriented x4;  grossly normal neurologically.  Impression/Plan: Annette Coleman is here for a colonoscopy to be performed for history of colon polyps Risks, benefits, limitations, and alternatives regarding  colonoscopy have been reviewed with the patient.  Questions have been answered.  All parties agreeable.   Virgel Manifold, MD  01/04/2019, 8:11 AM

## 2019-01-04 NOTE — Transfer of Care (Signed)
Immediate Anesthesia Transfer of Care Note  Patient: Modesta Everest  Procedure(s) Performed: COLONOSCOPY WITH PROPOFOL (N/A )  Patient Location: PACU  Anesthesia Type:General  Level of Consciousness: awake  Airway & Oxygen Therapy: Patient Spontanous Breathing  Post-op Assessment: Report given to RN and Post -op Vital signs reviewed and stable  Post vital signs: Reviewed and stable  Last Vitals:  Vitals Value Taken Time  BP 127/66 01/04/19 0900  Temp    Pulse 60 01/04/19 0902  Resp 19 01/04/19 0902  SpO2 95 % 01/04/19 0902  Vitals shown include unvalidated device data.  Last Pain:  Vitals:   01/04/19 0738  TempSrc: Tympanic  PainSc: 0-No pain         Complications: No apparent anesthesia complications

## 2019-01-04 NOTE — Anesthesia Preprocedure Evaluation (Signed)
Anesthesia Evaluation  Patient identified by MRN, date of birth, ID band Patient awake    Reviewed: Allergy & Precautions, H&P , NPO status , Patient's Chart, lab work & pertinent test results, reviewed documented beta blocker date and time   History of Anesthesia Complications Negative for: history of anesthetic complications  Airway Mallampati: III  TM Distance: >3 FB Neck ROM: full    Dental  (+) Dental Advidsory Given, Missing   Pulmonary neg pulmonary ROS, former smoker,    Pulmonary exam normal        Cardiovascular Exercise Tolerance: Good negative cardio ROS Normal cardiovascular exam     Neuro/Psych PSYCHIATRIC DISORDERS Depression negative neurological ROS     GI/Hepatic Neg liver ROS, GERD  ,  Endo/Other  neg diabetesHypothyroidism   Renal/GU negative Renal ROS  negative genitourinary   Musculoskeletal   Abdominal   Peds  Hematology negative hematology ROS (+)   Anesthesia Other Findings Past Medical History: No date: ADHD No date: Anemia 12/04/2017: Anemia 05/04/2017: Attention deficit hyperactivity disorder, predominantly  inattentive type No date: Depression No date: Elevated blood pressure reading without diagnosis of  hypertension No date: Hormone replacement therapy No date: Memory impairment No date: Thyroid disease 11/13/2016: Venous insufficiency of both lower extremities   Reproductive/Obstetrics negative OB ROS                             Anesthesia Physical Anesthesia Plan  ASA: II  Anesthesia Plan: General   Post-op Pain Management:    Induction: Intravenous  PONV Risk Score and Plan: 3 and TIVA and Propofol infusion  Airway Management Planned: Nasal Cannula  Additional Equipment:   Intra-op Plan:   Post-operative Plan:   Informed Consent: I have reviewed the patients History and Physical, chart, labs and discussed the procedure including the  risks, benefits and alternatives for the proposed anesthesia with the patient or authorized representative who has indicated his/her understanding and acceptance.     Dental Advisory Given  Plan Discussed with: Anesthesiologist, CRNA and Surgeon  Anesthesia Plan Comments:         Anesthesia Quick Evaluation

## 2019-01-04 NOTE — Anesthesia Postprocedure Evaluation (Signed)
Anesthesia Post Note  Patient: Annette Coleman  Procedure(s) Performed: COLONOSCOPY WITH PROPOFOL (N/A )  Patient location during evaluation: Endoscopy Anesthesia Type: General Level of consciousness: awake and alert Pain management: pain level controlled Vital Signs Assessment: post-procedure vital signs reviewed and stable Respiratory status: spontaneous breathing, nonlabored ventilation, respiratory function stable and patient connected to nasal cannula oxygen Cardiovascular status: blood pressure returned to baseline and stable Postop Assessment: no apparent nausea or vomiting Anesthetic complications: no     Last Vitals:  Vitals:   01/04/19 0910 01/04/19 0917  BP: 130/66   Pulse: (!) 56 (!) 47  Resp: 19 16  Temp:    SpO2: 96% 98%    Last Pain:  Vitals:   01/04/19 0738  TempSrc: Tympanic  PainSc: 0-No pain                 Martha Clan

## 2019-01-07 ENCOUNTER — Ambulatory Visit (INDEPENDENT_AMBULATORY_CARE_PROVIDER_SITE_OTHER): Payer: Self-pay | Admitting: Gastroenterology

## 2019-01-07 ENCOUNTER — Encounter: Payer: Self-pay | Admitting: Gastroenterology

## 2019-01-07 ENCOUNTER — Telehealth: Payer: Self-pay | Admitting: Gastroenterology

## 2019-01-07 ENCOUNTER — Encounter (INDEPENDENT_AMBULATORY_CARE_PROVIDER_SITE_OTHER): Payer: Self-pay

## 2019-01-07 ENCOUNTER — Other Ambulatory Visit: Payer: Self-pay

## 2019-01-07 ENCOUNTER — Ambulatory Visit: Payer: Self-pay | Admitting: *Deleted

## 2019-01-07 VITALS — BP 138/79 | HR 68

## 2019-01-07 DIAGNOSIS — Z0130 Encounter for examination of blood pressure without abnormal findings: Secondary | ICD-10-CM

## 2019-01-07 DIAGNOSIS — R42 Dizziness and giddiness: Secondary | ICD-10-CM

## 2019-01-07 LAB — SURGICAL PATHOLOGY

## 2019-01-07 NOTE — Telephone Encounter (Signed)
   Reason for Disposition . [1] Caller has NON-URGENT question AND [2] triager unable to answer question  Answer Assessment - Initial Assessment Questions 1. DATE/TIME: "When did you have your colonosocpy?"      01/04/2019 2. MAIN CONCERN: "What is your main concern right now?" "What questions do you have?"     Feeling lightheaded intermittently since colonoscopy 3. ABDOMEN PAIN: "Are you having any abdomen (belly or stomach) pain?" If Yes, "How bad is it?" (e.g., Scale 1-10; mild, moderate, severe).    - MILD (1-3): doesn't interfere with normal activities, abdomen soft and not tender to touch     - MODERATE (4-7): interferes with normal activities or awakens from sleep, tender to touch     - SEVERE (8-10): excruciating pain, doubled over, unable to do any normal activities       No abdominal pain 4. OTHER SYMPTOMS: "What other symptoms are you having?" (e.g., rectal bleeding, bloating or feeling gassy, passing gas, vomiting, dizziness, fever).     Denies all  5. ONSET: "When did your symptoms start?"     01/05/2019 6. PATTERN: "Is the symptom(s) constant or does it come and go?" "Is your symptom(s) getting worse, better, or staying the same?"     Intermittent  Protocols used: COLONOSCOPY SYMPTOMS AND QUESTIONS-A-AH  Patient states she had a colonoscopy 01/04/2019.  When she woke up on 01/05/2019 she said she felt "faint".  She ate cream of wheat and felt better after eating.  She did not feel lightheaded on 01/06/2019 and was eating and drinking normally.  States she felt "faint" again when she woke up this morning.  States describes the sensation as feeling lightheaded.  The room does not spin.  When she is seated she feels mildly lightheaded, worse with standing and moving around.  She does not feel that she will pass out.  Denies fever, abdominal pain, rectal bleeding.  Advised patient that she should make sure she is staying well hydrated, and recommended that she get something such as  Gatorade to replenish electrolytes. Also advised her to eat breakfast, as this helped when she felt faint on 01/05/2019.  Recommended that patient call her gastroenterologist to let them know she has been feeling lightheaded intermittently since her colonoscopy, she states she has left them a message.  Advised patient if lightheadedness becomes severe and she feels like she might pass out, or she has and shortness of breath or chest pain she should seek treatment at ER, patient expressed understanding.   Advised her I will send message to her PCP office and ask them to call her with any advice.

## 2019-01-07 NOTE — Telephone Encounter (Signed)
Pt left vm she states she had a colonoscopy Friday and ever since then feels fainty please call pt ASAP

## 2019-01-07 NOTE — Telephone Encounter (Signed)
Patient states on Saturday morning she slept till 12:30. Patient states when she got up and moved around she got dizzy. Patient states she has been trying to hydrate. Denies any abdominal pain.  Patient states same thing happen Sunday and Today.  Per Dr. Bonna Gains patient needed a nurse visit to check her blood pressure and lab work. Labs are CBC and CMP.  Patient states she will come this afternoon between 1-4 pm put in lab order

## 2019-01-07 NOTE — Telephone Encounter (Signed)
Patient called [facility] and they had her come in the office to check her BP-138/79, Pulse rate 68 and had her do some blood work. Then sent her home.

## 2019-01-07 NOTE — Telephone Encounter (Signed)
Called and left a message for call back  

## 2019-01-08 ENCOUNTER — Telehealth: Payer: Self-pay

## 2019-01-08 ENCOUNTER — Encounter: Payer: Self-pay | Admitting: Gastroenterology

## 2019-01-08 LAB — COMPREHENSIVE METABOLIC PANEL
ALT: 11 IU/L (ref 0–32)
AST: 15 IU/L (ref 0–40)
Albumin/Globulin Ratio: 1.6 (ref 1.2–2.2)
Albumin: 4.4 g/dL (ref 3.8–4.8)
Alkaline Phosphatase: 59 IU/L (ref 39–117)
BUN/Creatinine Ratio: 8 — ABNORMAL LOW (ref 12–28)
BUN: 7 mg/dL — ABNORMAL LOW (ref 8–27)
Bilirubin Total: 0.2 mg/dL (ref 0.0–1.2)
CO2: 27 mmol/L (ref 20–29)
Calcium: 9.6 mg/dL (ref 8.7–10.3)
Chloride: 103 mmol/L (ref 96–106)
Creatinine, Ser: 0.83 mg/dL (ref 0.57–1.00)
GFR calc Af Amer: 85 mL/min/{1.73_m2} (ref >59)
GFR calc non Af Amer: 74 mL/min/{1.73_m2} (ref >59)
Globulin, Total: 2.8 g/dL (ref 1.5–4.5)
Glucose: 78 mg/dL (ref 65–99)
Potassium: 4.9 mmol/L (ref 3.5–5.2)
Sodium: 141 mmol/L (ref 134–144)
Total Protein: 7.2 g/dL (ref 6.0–8.5)

## 2019-01-08 LAB — CBC
Hematocrit: 35.6 % (ref 34.0–46.6)
Hemoglobin: 12.3 g/dL (ref 11.1–15.9)
MCH: 32.8 pg (ref 26.6–33.0)
MCHC: 34.6 g/dL (ref 31.5–35.7)
MCV: 95 fL (ref 79–97)
Platelets: 345 10*3/uL (ref 150–450)
RBC: 3.75 x10E6/uL — ABNORMAL LOW (ref 3.77–5.28)
RDW: 11.6 % — ABNORMAL LOW (ref 11.7–15.4)
WBC: 7.9 10*3/uL (ref 3.4–10.8)

## 2019-01-08 NOTE — Telephone Encounter (Signed)
-----   Message from Virgel Manifold, MD sent at 01/08/2019 12:58 PM EDT ----- Caryl Pina please let the patient know, her bloodwork does not show any electrolyte abnormalities or any reason for concern. If she is still having symptoms, she should see her PCP

## 2019-01-08 NOTE — Telephone Encounter (Signed)
Patient verbalized understanding of lab results  

## 2019-01-28 ENCOUNTER — Telehealth: Payer: Self-pay | Admitting: Family Medicine

## 2019-01-28 ENCOUNTER — Telehealth: Payer: Self-pay

## 2019-01-28 DIAGNOSIS — R4189 Other symptoms and signs involving cognitive functions and awareness: Secondary | ICD-10-CM | POA: Insufficient documentation

## 2019-01-28 NOTE — Telephone Encounter (Signed)
faxed

## 2019-01-28 NOTE — Telephone Encounter (Signed)
Pt is requesting to have her last lab results sent to [facility] to Dr. Melrose Nakayama.   Phone: [phone] -pt says that she has a 12:30 apt this morning. Pt realizes that she should have called sooner to request this.   Please assist pt further.

## 2019-01-28 NOTE — Telephone Encounter (Signed)
Copied from [place] [phone]. Topic: General - Other >> Jan 28, 2019  9:55 AM Sheran Luz wrote: Patient requesting call from [place] to discuss labs that were supposed to be faxed to GI office. Patient is requesting a call back.

## 2019-01-28 NOTE — Telephone Encounter (Signed)
Tried calling pt twice, left detailed voicemail asking what Gi office I needed to fax labs to?  It looks like pt recently saw [facility] all labs in chart should be visible to that office.

## 2019-02-18 ENCOUNTER — Other Ambulatory Visit: Payer: Self-pay | Admitting: Family Medicine

## 2019-02-18 DIAGNOSIS — E039 Hypothyroidism, unspecified: Secondary | ICD-10-CM

## 2019-02-18 DIAGNOSIS — R5383 Other fatigue: Secondary | ICD-10-CM

## 2019-02-20 NOTE — Telephone Encounter (Signed)
Pt calling to check on this.  States she is completely out of medication.

## 2019-03-01 ENCOUNTER — Ambulatory Visit (INDEPENDENT_AMBULATORY_CARE_PROVIDER_SITE_OTHER): Payer: Managed Care, Other (non HMO) | Admitting: Family Medicine

## 2019-03-01 ENCOUNTER — Other Ambulatory Visit: Payer: Self-pay

## 2019-03-01 ENCOUNTER — Encounter: Payer: Self-pay | Admitting: Family Medicine

## 2019-03-01 VITALS — Ht 63.0 in | Wt 178.0 lb

## 2019-03-01 DIAGNOSIS — N3000 Acute cystitis without hematuria: Secondary | ICD-10-CM

## 2019-03-01 MED ORDER — CEPHALEXIN 500 MG PO CAPS: 500.0000 mg | ORAL_CAPSULE | Freq: Four times a day (QID) | ORAL | 0 refills | Status: AC

## 2019-03-01 NOTE — Progress Notes (Signed)
Name: Annette Coleman   MRN: [id number]    DOB: [birth year]   Date:03/01/2019       Progress Note  Subjective:    Chief Complaint  Chief Complaint  Patient presents with  . Dysuria    I connected with  Assunta Found  on 03/01/19 at  2:20 PM EST by a video enabled telemedicine application and verified that I am speaking with the correct person using two identifiers.  I discussed the limitations of evaluation and management by telemedicine and the availability of in person appointments. The patient expressed understanding and agreed to proceed. Staff also discussed with the patient that there may be a patient responsible charge related to this service. Patient Location: Work Provider Location:  [facility] clinic Additional Individuals present: none  Urinary Tract Infection  This is a new problem. The current episode started yesterday. The problem occurs every urination. The quality of the pain is described as burning (pressure). The pain is at a severity of 6/10. There has been no fever. Associated symptoms include frequency and urgency. Pertinent negatives include no chills, discharge, flank pain, hematuria, nausea, sweats or vomiting. Treatments tried: azo, cranberry juice. The treatment provided mild (keeping it bearable) relief.  Hx of UTI in the past, frequent until she had a "bladder wash out" then none for the past couple years but this is exactly what it feels like when she does get UTI.  She has had no hematuria, no vaginal symptoms denies any body aches, fever, sweats, nausea, vomiting, myalgias, headache   Patient Active Problem List   Diagnosis Date Noted  . History of colonic polyps   . Polyp of colon   . Gastroesophageal reflux disease 12/04/2018  . Mild episode of recurrent major depressive disorder ([place]) 12/04/2018  . Obesity (BMI 30.0-34.9) 05/23/2018  . Mixed hyperlipidemia 03/05/2018  . Current long-term use of postmenopausal hormone replacement therapy 06/10/2017  .  Memory difficulties 05/04/2017  . Vitamin D deficiency 02/08/2017  . Hypothyroidism 11/08/2016    Social History   Tobacco Use  . Smoking status: Former Smoker    Quit date: 2012    Years since quitting: 8.8  . Smokeless tobacco: Never Used  Substance Use Topics  . Alcohol use: Yes    Comment: rare     Current Outpatient Medications:  .  buPROPion (WELLBUTRIN XL) 300 MG 24 hr tablet, Take 1 tablet (300 mg total) by mouth daily., Disp: 90 tablet, Rfl: 1 .  cephALEXin (KEFLEX) 500 MG capsule, Take 1 capsule (500 mg total) by mouth 4 (four) times daily for 5 days., Disp: 20 capsule, Rfl: 0 .  cholecalciferol (VITAMIN D3) 25 MCG (1000 UT) tablet, Take 1,000 Units by mouth daily., Disp: , Rfl:  .  cyanocobalamin 1000 MCG tablet, Take 1,000 mcg by mouth daily., Disp: , Rfl:  .  ELDERBERRY PO, Take 1 tablet by mouth daily., Disp: , Rfl:  .  escitalopram (LEXAPRO) 10 MG tablet, Take 1 tablet (10 mg total) by mouth daily., Disp: 90 tablet, Rfl: 1 .  estradiol (ESTRACE) 0.5 MG tablet, Take 1 tablet (0.5 mg total) by mouth every other day. For 1-2 weeks then decrease to three times a week, Disp: 90 tablet, Rfl: 1 .  fluocinonide cream (LIDEX) AB-123456789 %, Apply 1 application topically 2 (two) times daily., Disp: , Rfl:  .  lidocaine (LIDODERM) 5 %, Place 1 patch onto the skin daily. Remove & Discard patch within 12 hours or as directed by MD, Disp: 30 patch, Rfl:  0 .  NON FORMULARY, Take 1 tablet by mouth as needed. arthriten, Disp: , Rfl:  .  nortriptyline (PAMELOR) 10 MG capsule, Take 20 mg by mouth at bedtime., Disp: , Rfl:  .  pantoprazole (PROTONIX) 20 MG tablet, Take 1 tablet (20 mg total) by mouth daily., Disp: 30 tablet, Rfl: 2 .  pravastatin (PRAVACHOL) 20 MG tablet, Take 1 tablet (20 mg total) by mouth at bedtime. For cholesterol, Disp: 90 tablet, Rfl: 3 .  TIROSINT 88 MCG CAPS, TAKE 1 CAPSULE (88 MCG TOTAL) BY MOUTH DAILY., Disp: 30 capsule, Rfl: 1  No Known Allergies  I personally  reviewed active problem list, medication list, allergies, health maintenance, notes from last encounter, lab results with the patient/caregiver today.  Review of Systems  Constitutional: Negative for chills, diaphoresis, fever, malaise/fatigue and weight loss.  HENT: Negative.   Eyes: Negative.   Respiratory: Negative.   Cardiovascular: Negative.   Gastrointestinal: Negative.  Negative for nausea and vomiting.  Genitourinary: Positive for frequency and urgency. Negative for flank pain and hematuria.  Musculoskeletal: Negative.  Negative for myalgias.  Skin: Negative.  Negative for itching and rash.  Neurological: Negative.  Negative for dizziness, weakness and headaches.  Psychiatric/Behavioral: Negative.      Objective:   Virtual encounter, vitals limited, only able to obtain the following There were no vitals filed for this visit. There is no height or weight on file to calculate BMI. Nursing Note and Vital Signs reviewed.  Physical Exam Patient alert, answering questions appropriately, normal phonation, no audible wheeze stridor or tachypnea PE limited by telephone encounter  No results found for this or any previous visit (from the past 72 hour(s)).  Assessment and Plan:     ICD-10-CM   1. Acute cystitis without hematuria  N30.00    empiric keflex tx, will need in office visit is sx do not resolve or if they worsen.  -Red flags and when to present for emergency care or RTC including fever >101.50F, chest pain, shortness of breath, new/worsening/un-resolving symptoms,  reviewed with patient at time of visit. Follow up and care instructions discussed and provided in AVS. - I discussed the assessment and treatment plan with the patient. The patient was provided an opportunity to ask questions and all were answered. The patient agreed with the plan and demonstrated an understanding of the instructions.  I provided 8 minutes of non-face-to-face time during this encounter.   Delsa Grana, PA-C 03/01/19 1:05 PM

## 2019-03-06 ENCOUNTER — Other Ambulatory Visit: Payer: Self-pay | Admitting: Family Medicine

## 2019-03-06 DIAGNOSIS — R1013 Epigastric pain: Secondary | ICD-10-CM

## 2019-03-06 DIAGNOSIS — K219 Gastro-esophageal reflux disease without esophagitis: Secondary | ICD-10-CM

## 2019-03-06 MED ORDER — PANTOPRAZOLE SODIUM 20 MG PO TBEC: 20.0000 mg | DELAYED_RELEASE_TABLET | Freq: Every day | ORAL | 1 refills | Status: DC

## 2019-03-06 NOTE — Telephone Encounter (Signed)
Medication refill: pantoprazole (PROTONIX) 20 MG tablet [id number]    Pharmacy:  [id number] - [place], [place] (Phone) [phone] (Fax)

## 2019-03-06 NOTE — Addendum Note (Signed)
Addended by: Jefferson Fuel on: 03/06/2019 03:13 PM   Modules accepted: Orders

## 2019-03-22 ENCOUNTER — Other Ambulatory Visit: Payer: Self-pay

## 2019-03-22 ENCOUNTER — Ambulatory Visit (INDEPENDENT_AMBULATORY_CARE_PROVIDER_SITE_OTHER): Payer: Managed Care, Other (non HMO) | Admitting: Family Medicine

## 2019-03-22 ENCOUNTER — Encounter: Payer: Self-pay | Admitting: Family Medicine

## 2019-03-22 ENCOUNTER — Telehealth: Payer: Self-pay

## 2019-03-22 VITALS — BP 116/72 | HR 82 | Temp 97.6°F | Resp 14 | Ht 62.0 in | Wt 176.0 lb

## 2019-03-22 DIAGNOSIS — R300 Dysuria: Secondary | ICD-10-CM | POA: Diagnosis not present

## 2019-03-22 DIAGNOSIS — R350 Frequency of micturition: Secondary | ICD-10-CM | POA: Diagnosis not present

## 2019-03-22 LAB — POCT URINALYSIS DIPSTICK
Bilirubin, UA: NEGATIVE
Glucose, UA: NEGATIVE
Ketones, UA: NEGATIVE
Nitrite, UA: NEGATIVE
Odor: NORMAL
Protein, UA: NEGATIVE
Spec Grav, UA: 1.02 (ref 1.010–1.025)
Urobilinogen, UA: 0.2 E.U./dL
pH, UA: 6 (ref 5.0–8.0)

## 2019-03-22 MED ORDER — NITROFURANTOIN MONOHYD MACRO 100 MG PO CAPS: 100.0000 mg | ORAL_CAPSULE | Freq: Two times a day (BID) | ORAL | 0 refills | Status: AC

## 2019-03-22 MED ORDER — PHENAZOPYRIDINE HCL 200 MG PO TABS: 200.0000 mg | ORAL_TABLET | Freq: Three times a day (TID) | ORAL | 0 refills | Status: DC | PRN

## 2019-03-22 NOTE — Progress Notes (Signed)
Name: Annette Coleman   MRN: [id number]    DOB: December 20, 1950   Date:03/22/2019       Progress Note  Subjective:    Chief Complaint  Chief Complaint  Patient presents with  . Urinary Tract Infection    abdominal pressure and frequency    I connected with  Assunta Found on 03/22/19 at  3:40 PM EST by telephone and verified that I am speaking with the correct person using two identifiers.   I discussed the limitations, risks, security and privacy concerns of performing an evaluation and management service by telephone and the availability of in person appointments. Staff also discussed with the patient that there may be a patient responsible charge related to this service. Patient Location: at work Provider Location: [facility] clinic Additional Individuals present: none  HPI Bladder pressure, urinary frequency, she had some mild back pain No hematuria, no abd pain, N, V, D, fever, chills, sweats, change in appetite. She has had sx gradually working for the past 1-2 weeks.  She is using AZO Recently had virtual visit for same and was prescribed keflex- pt was not able to come in to do any urine testing.  She did urine sample today, see below, had + leuks but otherwise negative.   She feels that keflex "was not strong enough" and she wants something else.  Results for orders placed or performed in visit on 03/22/19  POCT Urinalysis Dipstick  Result Value Ref Range   Color, UA yellow    Clarity, UA clear    Glucose, UA Negative Negative   Bilirubin, UA neg    Ketones, UA neg    Spec Grav, UA 1.020 1.010 - 1.025   Blood, UA trace    pH, UA 6.0 5.0 - 8.0   Protein, UA Negative Negative   Urobilinogen, UA 0.2 0.2 or 1.0 E.U./dL   Nitrite, UA neg    Leukocytes, UA Large (3+) (A) Negative   Appearance clear    Odor normal       Patient Active Problem List   Diagnosis Date Noted  . History of colonic polyps   . Polyp of colon   . Gastroesophageal reflux disease 12/04/2018  .  Mild episode of recurrent major depressive disorder ([place]) 12/04/2018  . Obesity (BMI 30.0-34.9) 05/23/2018  . Mixed hyperlipidemia 03/05/2018  . Current long-term use of postmenopausal hormone replacement therapy 06/10/2017  . Memory difficulties 05/04/2017  . Vitamin D deficiency 02/08/2017  . Hypothyroidism 11/08/2016    Social History   Tobacco Use  . Smoking status: Former Smoker    Quit date: 2012    Years since quitting: 8.9  . Smokeless tobacco: Never Used  Substance Use Topics  . Alcohol use: Yes    Comment: rare     Current Outpatient Medications:  .  buPROPion (WELLBUTRIN XL) 300 MG 24 hr tablet, Take 1 tablet (300 mg total) by mouth daily., Disp: 90 tablet, Rfl: 1 .  cholecalciferol (VITAMIN D3) 25 MCG (1000 UT) tablet, Take 1,000 Units by mouth daily., Disp: , Rfl:  .  cyanocobalamin 1000 MCG tablet, Take 1,000 mcg by mouth daily., Disp: , Rfl:  .  ELDERBERRY PO, Take 1 tablet by mouth daily., Disp: , Rfl:  .  escitalopram (LEXAPRO) 10 MG tablet, Take 1 tablet (10 mg total) by mouth daily., Disp: 90 tablet, Rfl: 1 .  estradiol (ESTRACE) 0.5 MG tablet, Take 1 tablet (0.5 mg total) by mouth every other day. For 1-2 weeks then decrease to  three times a week, Disp: 90 tablet, Rfl: 1 .  fluocinonide cream (LIDEX) [id number] %, Apply 1 application topically 2 (two) times daily., Disp: , Rfl:  .  lidocaine (LIDODERM) 5 %, Place 1 patch onto the skin daily. Remove & Discard patch within 12 hours or as directed by MD, Disp: 30 patch, Rfl: 0 .  NON FORMULARY, Take 1 tablet by mouth as needed. arthriten, Disp: , Rfl:  .  nortriptyline (PAMELOR) 10 MG capsule, Take 20 mg by mouth at bedtime., Disp: , Rfl:  .  pantoprazole (PROTONIX) 20 MG tablet, Take 1 tablet (20 mg total) by mouth daily., Disp: 90 tablet, Rfl: 1 .  pravastatin (PRAVACHOL) 20 MG tablet, Take 1 tablet (20 mg total) by mouth at bedtime. For cholesterol, Disp: 90 tablet, Rfl: 3 .  TIROSINT 88 MCG CAPS, TAKE 1 CAPSULE (88  MCG TOTAL) BY MOUTH DAILY., Disp: 30 capsule, Rfl: 1  No Known Allergies  I personally reviewed active problem list, medication list, allergies, family history, social history, health maintenance, notes from last encounter, lab results, imaging with the patient/caregiver today.  Review of Systems  Constitutional: Negative.   HENT: Negative.   Eyes: Negative.   Respiratory: Negative.   Cardiovascular: Negative.   Gastrointestinal: Negative.   Endocrine: Negative.   Genitourinary: Negative.   Musculoskeletal: Negative.   Skin: Negative.   Allergic/Immunologic: Negative.   Neurological: Negative.   Hematological: Negative.   Psychiatric/Behavioral: Negative.   All other systems reviewed and are negative.   Objective:    Virtual encounter, vitals limited, only able to obtain the following Today's Vitals   03/22/19 1448 03/22/19 1451  BP: 116/72   Pulse: 82   Resp: 14   Temp: 97.6 F (36.4 C)   SpO2: 98%   Weight: 176 lb (79.8 kg)   Height: 5\' 2"  (1.575 m)   PainSc:  4    Body mass index is 32.19 kg/m. Nursing Note and Vital Signs reviewed.  Physical Exam Vitals signs and nursing note reviewed.  Constitutional:      General: She is not in acute distress.    Appearance: Normal appearance. She is well-developed. She is obese. She is not ill-appearing, toxic-appearing or diaphoretic.  HENT:     Head: Normocephalic and atraumatic.     Nose: Nose normal.  Eyes:     General:        Right eye: No discharge.        Left eye: No discharge.     Conjunctiva/sclera: Conjunctivae normal.  Neck:     Trachea: No tracheal deviation.  Pulmonary:     Effort: Pulmonary effort is normal. No respiratory distress.     Breath sounds: No stridor.  Skin:    Coloration: Skin is not jaundiced or pale.     Findings: No rash.  Neurological:     Mental Status: She is alert.     Motor: No abnormal muscle tone.  Psychiatric:        Mood and Affect: Mood normal.        Behavior:  Behavior normal.     PE limited by telephone encounter  Results for orders placed or performed in visit on 03/22/19 (from the past 72 hour(s))  POCT Urinalysis Dipstick     Status: Abnormal   Collection Time: 03/22/19  2:56 PM  Result Value Ref Range   Color, UA yellow    Clarity, UA clear    Glucose, UA Negative Negative   Bilirubin, UA neg  Ketones, UA neg    Spec Grav, UA 1.020 1.010 - 1.025   Blood, UA trace    pH, UA 6.0 5.0 - 8.0   Protein, UA Negative Negative   Urobilinogen, UA 0.2 0.2 or 1.0 E.U./dL   Nitrite, UA neg    Leukocytes, UA Large (3+) (A) Negative   Appearance clear    Odor normal     Assessment and Plan:     ICD-10-CM   1. Urinary frequency  R35.0 POCT Urinalysis Dipstick    Urine Culture  2. Burning with urination  R30.0 POCT Urinalysis Dipstick    Urine Culture   Recurrent urinary sx concerning for UTI/cystitis with UA positive for trace blood and leuks, culture added since several episodes of suspected UTI's in the past couple months without any micro data.  She did not want Keflex although I did explain to her its efficacy, low side effect profile and minimal microbial resistance, but she would prefer a different medication out of some of the other options to fill nitrofurantoin may be the best and we discussed how symptoms is not tolerated for those over 65 but she would like to try it.  We discussed how if urine culture is negative there are multiple other possible causes of her urinary symptoms and she will need to come in in person and rule those out also physical exam be very helpful.  Could possibly be atrophic vaginitis, yeast infection, STDs though unlikely, or overactive bladder.  She reports that she has had a history of a "bladder wash out" before so she may need to follow-up with urology  -Red flags and when to present for emergency care or RTC including fever >101.75F, chest pain, shortness of breath, new/worsening/un-resolving symptoms,   reviewed with patient at time of visit. Follow up and care instructions discussed and provided in AVS. - I discussed the assessment and treatment plan with the patient. The patient was provided an opportunity to ask questions and all were answered. The patient agreed with the plan and demonstrated an understanding of the instructions.  - The patient was advised to call back or seek an in-person evaluation if the symptoms worsen or if the condition fails to improve as anticipated.  I provided 12 minutes of non-face-to-face time during this encounter.  Delsa Grana, PA-C 03/22/19 4:08 PM

## 2019-03-22 NOTE — Telephone Encounter (Signed)
Please place her on schedule for 3:40 for virtual.  She will go ahead and come in now for urine sample and vitals.  Thanks

## 2019-03-22 NOTE — Telephone Encounter (Signed)
Copied from [place] [id number]. Topic: Appointment Scheduling - Scheduling Inquiry for Clinic >> Mar 22, 2019  8:08 AM Scherrie Gerlach wrote: Reason for CRM: pt seen 11/13 for UTI and states she thinks it is back.  Pt would like to know if she can come in and give a UA sample.  No appts today for a visit. Please advise.

## 2019-03-22 NOTE — Telephone Encounter (Signed)
Pt notified will work her in for 3:40

## 2019-03-24 LAB — URINE CULTURE
MICRO NUMBER:: 1165870
SPECIMEN QUALITY:: ADEQUATE

## 2019-04-04 ENCOUNTER — Other Ambulatory Visit: Payer: Self-pay | Admitting: Family Medicine

## 2019-04-04 DIAGNOSIS — E039 Hypothyroidism, unspecified: Secondary | ICD-10-CM

## 2019-04-04 DIAGNOSIS — R5383 Other fatigue: Secondary | ICD-10-CM

## 2019-04-05 ENCOUNTER — Ambulatory Visit (INDEPENDENT_AMBULATORY_CARE_PROVIDER_SITE_OTHER): Payer: Managed Care, Other (non HMO) | Admitting: Family Medicine

## 2019-04-05 ENCOUNTER — Other Ambulatory Visit: Payer: Self-pay

## 2019-04-05 ENCOUNTER — Encounter: Payer: Self-pay | Admitting: Family Medicine

## 2019-04-05 VITALS — BP 130/80 | HR 81 | Temp 97.8°F | Resp 14 | Ht 62.0 in | Wt 173.0 lb

## 2019-04-05 DIAGNOSIS — Z5181 Encounter for therapeutic drug level monitoring: Secondary | ICD-10-CM

## 2019-04-05 DIAGNOSIS — E039 Hypothyroidism, unspecified: Secondary | ICD-10-CM

## 2019-04-05 DIAGNOSIS — E782 Mixed hyperlipidemia: Secondary | ICD-10-CM

## 2019-04-05 DIAGNOSIS — F330 Major depressive disorder, recurrent, mild: Secondary | ICD-10-CM

## 2019-04-05 DIAGNOSIS — E559 Vitamin D deficiency, unspecified: Secondary | ICD-10-CM

## 2019-04-05 DIAGNOSIS — K219 Gastro-esophageal reflux disease without esophagitis: Secondary | ICD-10-CM

## 2019-04-05 DIAGNOSIS — R350 Frequency of micturition: Secondary | ICD-10-CM

## 2019-04-05 LAB — POCT URINALYSIS DIPSTICK
Bilirubin, UA: NEGATIVE
Blood, UA: NEGATIVE
Glucose, UA: NEGATIVE
Ketones, UA: NEGATIVE
Leukocytes, UA: NEGATIVE
Nitrite, UA: NEGATIVE
Odor: NORMAL
Protein, UA: NEGATIVE
Spec Grav, UA: 1.015 (ref 1.010–1.025)
Urobilinogen, UA: 0.2 E.U./dL
pH, UA: 5.5 (ref 5.0–8.0)

## 2019-04-05 MED ORDER — LEVOTHYROXINE SODIUM 75 MCG PO CAPS: 75.0000 ug | ORAL_CAPSULE | Freq: Every day | ORAL | 1 refills | Status: DC

## 2019-04-05 NOTE — Progress Notes (Signed)
Name: Annette Coleman   MRN: [id number]    DOB: December 23, 1950   Date:04/05/2019       Progress Note  Chief Complaint  Patient presents with  . Follow-up  . Depression  . Hyperlipidemia     Subjective:   Annette Coleman is a 68 y.o. female, presents to clinic for routine follow up on the conditions listed above.   Hyperlipidemia:   Current Medication Regimen  Pravastatin 20 mg Last Lipids: Lab Results  Component Value Date   CHOL 137 01/01/2019   HDL 58 01/01/2019   LDLCALC 63 01/01/2019   TRIG 84 01/01/2019   CHOLHDL 2.4 01/01/2019   - Current Diet:  Avoids fried foods, cooks good, but ice cream and sweets - Denies: Chest pain, shortness of breath, myalgias. She has some SE of nightmares  - Documented aortic atherosclerosis? No - Risk factors for atherosclerosis: hypercholesterolemia  GERD: - Current medication regimen: protonix 20 mg daily  - Possible Triggers: no or minimal alcohol, nonsmoker, no or mild caffeine use, no ASA or NSAID's no identified triggers - Endorses:  No sx - Denies: abdominal pain, chest pain, wheezing, weight loss, dysphagia, black stools, hematemesis, diarrhea, constipation, fever, history of PUD or history of GI bleeding  MDD: On lexapro 10 mg and wellbutrin mood good Depression screen Annette Coleman 2/9 04/05/2019 03/22/2019 12/04/2018  Decreased Interest 0 0 1  Down, Depressed, Hopeless 0 0 0  PHQ - 2 Score 0 0 1  Altered sleeping 0 0 0  Tired, decreased energy 3 0 3  Change in appetite 0 0 1  Feeling bad or failure about yourself  0 0 0  Trouble concentrating 3 0 3  Moving slowly or fidgety/restless 0 0 0  Suicidal thoughts 0 0 0  PHQ-9 Score 6 0 8  Difficult doing work/chores Somewhat difficult Not difficult at all Somewhat difficult    Hypothyroidism: History: hypothyroidism Current Medication Regimen: 88 mcg synthroid Takes medicine daily in the am Current Symptoms:  Having sweats all the time, not sure if its new pill of wellbutrin,  hormone pill, or thyroid, levels were high last labs, did not adjust meds and she continued 88 mcg, was on 75 mcg before Most recent results are below; we will be repeating labs today. Lab Results  Component Value Date   TSH 0.33 (L) 01/01/2019   Sweats:  Not sure if change in wellbutrin manufacturer or thyroid med is causing.    Urinary frequency/UTI: Patient had presented few times for urinary symptoms and urinary frequency her last urine culture was positive for E. coli and she did complete her antibiotics with improved symptoms.  She denies any continued urinary frequency, urinary urgency, dysuria, hematuria, nocturia.  Not having any urge or stress incontinence and overall she feels this is improved but she would like to have her urine rechecked.   Patient Active Problem List   Diagnosis Date Noted  . History of colonic polyps   . Polyp of colon   . Gastroesophageal reflux disease 12/04/2018  . Mild episode of recurrent major depressive disorder ([place]) 12/04/2018  . Obesity (BMI 30.0-34.9) 05/23/2018  . Mixed hyperlipidemia 03/05/2018  . Current long-term use of postmenopausal hormone replacement therapy 06/10/2017  . Memory difficulties 05/04/2017  . Vitamin D deficiency 02/08/2017  . Hypothyroidism 11/08/2016    Past Surgical History:  Procedure Laterality Date  . ABDOMINAL HYSTERECTOMY    . APPENDECTOMY    . BUNIONECTOMY    . CHOLECYSTECTOMY    . COLONOSCOPY WITH  PROPOFOL N/A 01/04/2019   Procedure: COLONOSCOPY WITH PROPOFOL;  Surgeon: Virgel Manifold, MD;  Location: [facility] ENDOSCOPY;  Service: Endoscopy;  Laterality: N/A;  . complete parotidectomy    . ESOPHAGOGASTRODUODENOSCOPY    . jaw arthroscopy    . TUBAL LIGATION      Family History  Problem Relation Age of Onset  . Stroke Mother   . Hypertension Mother   . Prostate cancer Father   . Cancer Sister   . Hypertension Sister     Social History   Socioeconomic History  . Marital status: Married     Spouse name: Annette Coleman  . Number of children: 2  . Years of education: 63  . Highest education level: Associate degree: occupational, Hotel manager, or vocational program  Occupational History  . Not on file  Tobacco Use  . Smoking status: Former Smoker    Quit date: 2012    Years since quitting: 8.9  . Smokeless tobacco: Never Used  Substance and Sexual Activity  . Alcohol use: Yes    Comment: rare  . Drug use: Not Currently  . Sexual activity: Not Currently  Other Topics Concern  . Not on file  Social History Narrative  . Not on file   Social Determinants of Health   Financial Resource Strain: Low Risk   . Difficulty of Paying Living Expenses: Not hard at all  Food Insecurity: No Food Insecurity  . Worried About Charity fundraiser in the Last Year: Never true  . Ran Out of Food in the Last Year: Never true  Transportation Needs: No Transportation Needs  . Lack of Transportation (Medical): No  . Lack of Transportation (Non-Medical): No  Physical Activity: Inactive  . Days of Exercise per Week: 0 days  . Minutes of Exercise per Session: 0 min  Stress: No Stress Concern Present  . Feeling of Stress : Not at all  Social Connections: Moderately Isolated  . Frequency of Communication with Friends and Family: Never  . Frequency of Social Gatherings with Friends and Family: Never  . Attends Religious Services: Never  . Active Member of Clubs or Organizations: No  . Attends Archivist Meetings: Never  . Marital Status: Married  Human resources officer Violence: Not At Risk  . Fear of Current or Ex-Partner: No  . Emotionally Abused: No  . Physically Abused: No  . Sexually Abused: No     Current Outpatient Medications:  .  Apple Cider Vinegar 188 MG CAPS, Take by mouth., Disp: , Rfl:  .  buPROPion (WELLBUTRIN XL) 300 MG 24 hr tablet, Take 1 tablet (300 mg total) by mouth daily., Disp: 90 tablet, Rfl: 1 .  cholecalciferol (VITAMIN D3) 25 MCG (1000 UT) tablet, Take 1,000 Units  by mouth daily., Disp: , Rfl:  .  cyanocobalamin 1000 MCG tablet, Take 1,000 mcg by mouth daily., Disp: , Rfl:  .  ELDERBERRY PO, Take 1 tablet by mouth daily., Disp: , Rfl:  .  escitalopram (LEXAPRO) 10 MG tablet, Take 1 tablet (10 mg total) by mouth daily., Disp: 90 tablet, Rfl: 1 .  nortriptyline (PAMELOR) 10 MG capsule, Take 20 mg by mouth at bedtime., Disp: , Rfl:  .  pantoprazole (PROTONIX) 20 MG tablet, Take 1 tablet (20 mg total) by mouth daily., Disp: 90 tablet, Rfl: 1 .  pravastatin (PRAVACHOL) 20 MG tablet, Take 1 tablet (20 mg total) by mouth at bedtime. For cholesterol, Disp: 90 tablet, Rfl: 3 .  TIROSINT 88 MCG CAPS, TAKE 1 CAPSULE (  88 MCG TOTAL) BY MOUTH DAILY., Disp: 30 capsule, Rfl: 5 .  estradiol (ESTRACE) 0.5 MG tablet, Take 1 tablet (0.5 mg total) by mouth every other day. For 1-2 weeks then decrease to three times a week (Patient not taking: Reported on 04/05/2019), Disp: 90 tablet, Rfl: 1  No Known Allergies  I personally reviewed active problem list, medication list, allergies, family history, social history, health maintenance, notes from last encounter, lab results, imaging with the patient/caregiver today.   Review of Systems  Constitutional: Negative.   HENT: Negative.   Eyes: Negative.   Respiratory: Negative.   Cardiovascular: Negative.   Gastrointestinal: Negative.   Endocrine: Negative.   Genitourinary: Negative.   Musculoskeletal: Negative.   Skin: Negative.   Allergic/Immunologic: Negative.   Neurological: Negative.   Hematological: Negative.   Psychiatric/Behavioral: Negative.   All other systems reviewed and are negative.     Objective:    Virtual encounter, vitals limited, only able to obtain the following Today's Vitals   04/05/19 1010  BP: 130/80  Pulse: 81  Resp: 14  Temp: 97.8 F (36.6 C)  SpO2: 99%  Weight: 173 lb (78.5 kg)  Height: 5\' 2"  (1.575 m)   Body mass index is 31.64 kg/m. Nursing Note and Vital Signs  reviewed.  Physical Exam Vitals and nursing note reviewed.  Constitutional:      General: She is not in acute distress.    Appearance: Normal appearance. She is well-developed. She is obese. She is not ill-appearing, toxic-appearing or diaphoretic.     Interventions: Face mask in place.     Comments: Well-appearing, slightly anxious female no diaphoresis  HENT:     Head: Normocephalic and atraumatic.     Right Ear: External ear normal.     Left Ear: External ear normal.  Eyes:     General: Lids are normal. No scleral icterus.       Right eye: No discharge.        Left eye: No discharge.     Conjunctiva/sclera: Conjunctivae normal.     Pupils: Pupils are equal, round, and reactive to light.  Neck:     Trachea: Phonation normal. No tracheal deviation.  Cardiovascular:     Rate and Rhythm: Normal rate and regular rhythm.     Pulses: Normal pulses.          Radial pulses are 2+ on the right side and 2+ on the left side.       Posterior tibial pulses are 2+ on the right side and 2+ on the left side.     Heart sounds: Normal heart sounds. No murmur. No friction rub. No gallop.   Pulmonary:     Effort: Pulmonary effort is normal. No respiratory distress.     Breath sounds: Normal breath sounds. No stridor. No wheezing, rhonchi or rales.  Chest:     Chest wall: No tenderness.  Abdominal:     General: Bowel sounds are normal. There is no distension.     Palpations: Abdomen is soft.     Tenderness: There is no abdominal tenderness. There is no guarding or rebound.  Musculoskeletal:        General: No swelling or deformity. Normal range of motion.     Cervical back: Normal range of motion and neck supple.     Right lower leg: No edema.     Left lower leg: No edema.  Lymphadenopathy:     Cervical: No cervical adenopathy.  Skin:    General:  Skin is warm and dry.     Capillary Refill: Capillary refill takes less than 2 seconds.     Coloration: Skin is not jaundiced or pale.      Findings: No bruising, erythema or rash.  Neurological:     Mental Status: She is alert and oriented to person, place, and time.     Motor: No weakness or abnormal muscle tone.     Coordination: Coordination normal.     Gait: Gait normal.  Psychiatric:        Mood and Affect: Mood normal.        Speech: Speech normal.        Behavior: Behavior normal.       No results found for this or any previous visit (from the past 72 hour(s)).  PHQ2/9: Depression screen [facility] 2/9 04/05/2019 03/22/2019 12/04/2018 06/13/2018 05/23/2018  Decreased Interest 0 0 1 0 2  Down, Depressed, Hopeless 0 0 0 0 2  PHQ - 2 Score 0 0 1 0 4  Altered sleeping 0 0 0 0 0  Tired, decreased energy 3 0 3 0 2  Change in appetite 0 0 1 0 0  Feeling bad or failure about yourself  0 0 0 0 0  Trouble concentrating 3 0 3 0 1  Moving slowly or fidgety/restless 0 0 0 0 0  Suicidal thoughts 0 0 0 0 0  PHQ-9 Score 6 0 8 0 7  Difficult doing work/chores Somewhat difficult Not difficult at all Somewhat difficult Not difficult at all Not difficult at all   PHQ-2/9 Result is positive, on meds, denies SI/HI, she has specialist as well and says she will follow up with them on med concerns.  Fall Risk: Fall Risk  04/05/2019 03/22/2019 12/04/2018 06/13/2018 05/23/2018  Falls in the past year? 0 0 0 0 0  Number falls in past yr: 0 0 0 - 0  Injury with Fall? 0 0 0 - 0  Follow up - - Falls evaluation completed - -     Assessment and Plan:     1. Urinary frequency Urine dip is negative no current symptoms no need to further evaluate - POCT Urinalysis Dipstick  2. Mixed hyperlipidemia Compliant with medications no myalgias we will recheck her liver function today lipids were recently done we will defer those until her next routine follow-up visit - CMP w GFR  3. Hypothyroidism, unspecified type TSH was low with her last visit and at that time she was having symptoms of chemical hyperthyroid and was continuing to have fatigue  concerns we left her dose at 88 mcg the same now she complains of sweats and possibly some increased anxiety and she does want to decrease the Tirosint dose.  We discussed doing some adjusted dosing if she is having trouble finding a balance between 75 mcg and 88 mcg and the capsules do make this difficult Will decrease dose to 75 and recheck labs, f/up re: sx's, and pt will do TSH repeat in 6 weeks. - TSH - Levothyroxine Sodium (TIROSINT) 75 MCG CAPS; Take 1 capsule (75 mcg total) by mouth daily before breakfast.  Dispense: 90 capsule; Refill: 1  4. Medication monitoring encounter - CBC w/ Diff - CMP w GFR - TSH - Vit D  5. Mild episode of recurrent major depressive disorder ([place]) PHQ positive, discussed med options, but patient did not want to change any meds today and she will follow up with her psychiatrist  6. Gastroesophageal reflux disease, unspecified  whether esophagitis present Well-controlled GERD with her daily Protonix  7. Vitamin D deficiency - Vit D     Delsa Grana, PA-C 04/05/19 10:23 AM

## 2019-04-05 NOTE — Progress Notes (Deleted)
Name: Annette Coleman   MRN: [id number]    DOB: 04-05-1951   Date:04/05/2019       Progress Note  Chief Complaint  Patient presents with  . Follow-up  . Depression  . Hyperlipidemia     Subjective:   Annette Coleman is a 68 y.o. female, presents to clinic for routine follow up on the conditions listed above.  ***  Patient Active Problem List   Diagnosis Date Noted  . History of colonic polyps   . Polyp of colon   . Gastroesophageal reflux disease 12/04/2018  . Mild episode of recurrent major depressive disorder ([place]) 12/04/2018  . Obesity (BMI 30.0-34.9) 05/23/2018  . Mixed hyperlipidemia 03/05/2018  . Current long-term use of postmenopausal hormone replacement therapy 06/10/2017  . Memory difficulties 05/04/2017  . Vitamin D deficiency 02/08/2017  . Hypothyroidism 11/08/2016    Past Surgical History:  Procedure Laterality Date  . ABDOMINAL HYSTERECTOMY    . APPENDECTOMY    . BUNIONECTOMY    . CHOLECYSTECTOMY    . COLONOSCOPY WITH PROPOFOL N/A 01/04/2019   Procedure: COLONOSCOPY WITH PROPOFOL;  Surgeon: Virgel Manifold, MD;  Location: [facility] ENDOSCOPY;  Service: Endoscopy;  Laterality: N/A;  . complete parotidectomy    . ESOPHAGOGASTRODUODENOSCOPY    . jaw arthroscopy    . TUBAL LIGATION      Family History  Problem Relation Age of Onset  . Stroke Mother   . Hypertension Mother   . Prostate cancer Father   . Cancer Sister   . Hypertension Sister     Social History   Socioeconomic History  . Marital status: Married    Spouse name: Shirlean Mylar  . Number of children: 2  . Years of education: 99  . Highest education level: Associate degree: occupational, Hotel manager, or vocational program  Occupational History  . Not on file  Tobacco Use  . Smoking status: Former Smoker    Quit date: 2012    Years since quitting: 8.9  . Smokeless tobacco: Never Used  Substance and Sexual Activity  . Alcohol use: Yes    Comment: rare  . Drug use: Not Currently  .  Sexual activity: Not Currently  Other Topics Concern  . Not on file  Social History Narrative  . Not on file   Social Determinants of Health   Financial Resource Strain: Low Risk   . Difficulty of Paying Living Expenses: Not hard at all  Food Insecurity: No Food Insecurity  . Worried About Charity fundraiser in the Last Year: Never true  . Ran Out of Food in the Last Year: Never true  Transportation Needs: No Transportation Needs  . Lack of Transportation (Medical): No  . Lack of Transportation (Non-Medical): No  Physical Activity: Inactive  . Days of Exercise per Week: 0 days  . Minutes of Exercise per Session: 0 min  Stress: No Stress Concern Present  . Feeling of Stress : Not at all  Social Connections: Moderately Isolated  . Frequency of Communication with Friends and Family: Never  . Frequency of Social Gatherings with Friends and Family: Never  . Attends Religious Services: Never  . Active Member of Clubs or Organizations: No  . Attends Archivist Meetings: Never  . Marital Status: Married  Human resources officer Violence: Not At Risk  . Fear of Current or Ex-Partner: No  . Emotionally Abused: No  . Physically Abused: No  . Sexually Abused: No     Current Outpatient Medications:  .  Apple  Cider Vinegar 188 MG CAPS, Take by mouth., Disp: , Rfl:  .  buPROPion (WELLBUTRIN XL) 300 MG 24 hr tablet, Take 1 tablet (300 mg total) by mouth daily., Disp: 90 tablet, Rfl: 1 .  cholecalciferol (VITAMIN D3) 25 MCG (1000 UT) tablet, Take 1,000 Units by mouth daily., Disp: , Rfl:  .  cyanocobalamin 1000 MCG tablet, Take 1,000 mcg by mouth daily., Disp: , Rfl:  .  ELDERBERRY PO, Take 1 tablet by mouth daily., Disp: , Rfl:  .  escitalopram (LEXAPRO) 10 MG tablet, Take 1 tablet (10 mg total) by mouth daily., Disp: 90 tablet, Rfl: 1 .  nortriptyline (PAMELOR) 10 MG capsule, Take 20 mg by mouth at bedtime., Disp: , Rfl:  .  pantoprazole (PROTONIX) 20 MG tablet, Take 1 tablet (20 mg  total) by mouth daily., Disp: 90 tablet, Rfl: 1 .  pravastatin (PRAVACHOL) 20 MG tablet, Take 1 tablet (20 mg total) by mouth at bedtime. For cholesterol, Disp: 90 tablet, Rfl: 3 .  TIROSINT 88 MCG CAPS, TAKE 1 CAPSULE (88 MCG TOTAL) BY MOUTH DAILY., Disp: 30 capsule, Rfl: 5 .  estradiol (ESTRACE) 0.5 MG tablet, Take 1 tablet (0.5 mg total) by mouth every other day. For 1-2 weeks then decrease to three times a week (Patient not taking: Reported on 04/05/2019), Disp: 90 tablet, Rfl: 1  No Known Allergies  I personally reviewed {[id number]} with the patient/caregiver today.  Review of Systems   Objective:    Vitals:   04/05/19 1010  BP: 130/80  Pulse: 81  Resp: 14  Temp: 97.8 F (36.6 C)  SpO2: 99%  Weight: 173 lb (78.5 kg)  Height: 5\' 2"  (1.575 m)    Body mass index is 31.64 kg/m.  Physical Exam   Recent Results (from the past 2160 hour(s))  CBC     Status: Abnormal   Collection Time: 01/07/19  1:50 PM  Result Value Ref Range   WBC 7.9 3.4 - 10.8 x10E3/uL   RBC 3.75 (L) 3.77 - 5.28 x10E6/uL   Hemoglobin 12.3 11.1 - 15.9 g/dL   Hematocrit 35.6 34.0 - 46.6 %   MCV 95 79 - 97 fL   MCH 32.8 26.6 - 33.0 pg   MCHC 34.6 31.5 - 35.7 g/dL   RDW 11.6 (L) 11.7 - 15.4 %   Platelets 345 150 - 450 x10E3/uL  Comprehensive Metabolic Panel (CMET)     Status: Abnormal   Collection Time: 01/07/19  1:50 PM  Result Value Ref Range   Glucose 78 65 - 99 mg/dL   BUN 7 (L) 8 - 27 mg/dL   Creatinine, Ser 0.83 0.57 - 1.00 mg/dL   GFR calc non Af Amer 74 >59 mL/min/1.73   GFR calc Af Amer 85 >59 mL/min/1.73   BUN/Creatinine Ratio 8 (L) 12 - 28   Sodium 141 134 - 144 mmol/L   Potassium 4.9 3.5 - 5.2 mmol/L   Chloride 103 96 - 106 mmol/L   CO2 27 20 - 29 mmol/L   Calcium 9.6 8.7 - 10.3 mg/dL   Total Protein 7.2 6.0 - 8.5 g/dL   Albumin 4.4 3.8 - 4.8 g/dL   Globulin, Total 2.8 1.5 - 4.5 g/dL   Albumin/Globulin Ratio 1.6 1.2 - 2.2   Bilirubin Total 0.2 0.0 - 1.2 mg/dL   Alkaline  Phosphatase 59 39 - 117 IU/L   AST 15 0 - 40 IU/L   ALT 11 0 - 32 IU/L  Urine Culture     Status: Abnormal  Collection Time: 03/22/19 12:00 AM   Specimen: Urine  Result Value Ref Range   MICRO NUMBER: [id number]    SPECIMEN QUALITY: Adequate    Sample Source URINE    STATUS: FINAL    ISOLATE 1: Escherichia coli (A)     Comment: 10,000-49,000 CFU/mL of Escherichia coli      Susceptibility   Escherichia coli - URINE CULTURE, REFLEX    AMOX/CLAVULANIC 4 Sensitive     AMPICILLIN >=32 Resistant     AMPICILLIN/SULBACTAM 16 Intermediate     CEFAZOLIN* <=4 Not Reportable      * For infections other than uncomplicated UTIcaused by E. coli, K. pneumoniae or P. mirabilis:Cefazolin is resistant if MIC > or = 8 mcg/mL.(Distinguishing susceptible versus intermediatefor isolates with MIC < or = 4 mcg/mL requiresadditional testing.)For uncomplicated UTI caused by E. coli,K. pneumoniae or P. mirabilis: Cefazolin issusceptible if MIC <32 mcg/mL and predictssusceptible to the oral agents cefaclor, cefdinir,cefpodoxime, cefprozil, cefuroxime, cephalexinand loracarbef.    CEFEPIME <=1 Sensitive     CEFTRIAXONE <=1 Sensitive     CIPROFLOXACIN <=0.25 Sensitive     LEVOFLOXACIN <=0.12 Sensitive     ERTAPENEM <=0.5 Sensitive     GENTAMICIN <=1 Sensitive     IMIPENEM <=0.25 Sensitive     NITROFURANTOIN 32 Sensitive     PIP/TAZO <=4 Sensitive     TOBRAMYCIN <=1 Sensitive     TRIMETH/SULFA* <=20 Sensitive      * For infections other than uncomplicated UTIcaused by E. coli, K. pneumoniae or P. mirabilis:Cefazolin is resistant if MIC > or = 8 mcg/mL.(Distinguishing susceptible versus intermediatefor isolates with MIC < or = 4 mcg/mL requiresadditional testing.)For uncomplicated UTI caused by E. coli,K. pneumoniae or P. mirabilis: Cefazolin issusceptible if MIC <32 mcg/mL and predictssusceptible to the oral agents cefaclor, cefdinir,cefpodoxime, cefprozil, cefuroxime, cephalexinand loracarbef.Legend:S = Susceptible   I = IntermediateR = Resistant  NS = Not susceptible* = Not tested  NR = Not reported**NN = See antimicrobic comments  POCT Urinalysis Dipstick     Status: Abnormal   Collection Time: 03/22/19  2:56 PM  Result Value Ref Range   Color, UA yellow    Clarity, UA clear    Glucose, UA Negative Negative   Bilirubin, UA neg    Ketones, UA neg    Spec Grav, UA 1.020 1.010 - 1.025   Blood, UA trace    pH, UA 6.0 5.0 - 8.0   Protein, UA Negative Negative   Urobilinogen, UA 0.2 0.2 or 1.0 E.U./dL   Nitrite, UA neg    Leukocytes, UA Large (3+) (A) Negative   Appearance clear    Odor normal     Diabetic Foot Exam: Diabetic Foot Exam - Simple   No data filed       PHQ2/9: Depression screen [facility] [place] [facility] 2/9 04/05/2019 03/22/2019 12/04/2018 06/13/2018 05/23/2018  Decreased Interest 0 0 1 0 2  Down, Depressed, Hopeless 0 0 0 0 2  PHQ - 2 Score 0 0 1 0 4  Altered sleeping 0 0 0 0 0  Tired, decreased energy 3 0 3 0 2  Change in appetite 0 0 1 0 0  Feeling bad or failure about yourself  0 0 0 0 0  Trouble concentrating 3 0 3 0 1  Moving slowly or fidgety/restless 0 0 0 0 0  Suicidal thoughts 0 0 0 0 0  PHQ-9 Score 6 0 8 0 7  Difficult doing work/chores Somewhat difficult Not difficult at all Somewhat difficult Not difficult at  all Not difficult at all    phq 9 is {gen pos [id number] ***  Fall Risk: Fall Risk  04/05/2019 03/22/2019 12/04/2018 06/13/2018 05/23/2018  Falls in the past year? 0 0 0 0 0  Number falls in past yr: 0 0 0 - 0  Injury with Fall? 0 0 0 - 0  Follow up - - Falls evaluation completed - -      Functional Status Survey: Is the patient deaf or have difficulty hearing?: No Does the patient have difficulty seeing, even when wearing glasses/contacts?: No Does the patient have difficulty concentrating, remembering, or making decisions?: No Does the patient have difficulty walking or climbing stairs?: No Does the patient have difficulty dressing or bathing?: No Does the patient have  difficulty doing errands alone such as visiting a doctor's office or shopping?: No    Assessment & Plan:   Problem List Items Addressed This Visit    None       No follow-ups on file.   Annette Grana, PA-C 04/05/19 10:30 AM

## 2019-04-05 NOTE — Patient Instructions (Signed)
We will decrease you thyroid meds - down to 75 mcg again.  We will need to recheck the TSH again in 6 weeks.  And let me know if you have any improvement in the sweats.  You can decrease the pravastatin to a few times a week - We will want to check your lipid panel in 3-6 months from now to recheck.

## 2019-04-08 ENCOUNTER — Telehealth: Payer: Self-pay | Admitting: Family Medicine

## 2019-04-08 NOTE — Telephone Encounter (Signed)
Please call the pharmacy and advise of patient's preference for brand levothyroxine.

## 2019-04-08 NOTE — Telephone Encounter (Signed)
Pt said that her thyroid med should have been TIROSINT 75MG  LEVOTHYROXINE SODIUM. Does not want the generic. Wants Tirosint.

## 2019-04-09 LAB — COMPLETE METABOLIC PANEL WITH GFR
AG Ratio: 1.7 (calc) (ref 1.0–2.5)
ALT: 15 U/L (ref 6–29)
AST: 15 U/L (ref 10–35)
Albumin: 4.3 g/dL (ref 3.6–5.1)
Alkaline phosphatase (APISO): 48 U/L (ref 37–153)
BUN: 13 mg/dL (ref 7–25)
CO2: 28 mmol/L (ref 20–32)
Calcium: 9.5 mg/dL (ref 8.6–10.4)
Chloride: 104 mmol/L (ref 98–110)
Creat: 0.88 mg/dL (ref 0.50–0.99)
GFR, Est African American: 79 mL/min/{1.73_m2} (ref >=60)
GFR, Est Non African American: 68 mL/min/{1.73_m2} (ref >=60)
Globulin: 2.6 g/dL (calc) (ref 1.9–3.7)
Glucose, Bld: 94 mg/dL (ref 65–99)
Potassium: 4.5 mmol/L (ref 3.5–5.3)
Sodium: 138 mmol/L (ref 135–146)
Total Bilirubin: 0.5 mg/dL (ref 0.2–1.2)
Total Protein: 6.9 g/dL (ref 6.1–8.1)

## 2019-04-09 LAB — TSH: TSH: 0.09 mIU/L — ABNORMAL LOW (ref 0.40–4.50)

## 2019-04-09 LAB — CBC WITH DIFFERENTIAL/PLATELET
Absolute Monocytes: 570 cells/uL (ref 200–950)
Basophils Absolute: 68 cells/uL (ref 0–200)
Basophils Relative: 1.2 %
Eosinophils Absolute: 371 cells/uL (ref 15–500)
Eosinophils Relative: 6.5 %
HCT: 35.5 % (ref 35.0–45.0)
Hemoglobin: 11.9 g/dL (ref 11.7–15.5)
Lymphs Abs: 1733 cells/uL (ref 850–3900)
MCH: 31.4 pg (ref 27.0–33.0)
MCHC: 33.5 g/dL (ref 32.0–36.0)
MCV: 93.7 fL (ref 80.0–100.0)
MPV: 10.1 fL (ref 7.5–12.5)
Monocytes Relative: 10 %
Neutro Abs: 2958 cells/uL (ref 1500–7800)
Neutrophils Relative %: 51.9 %
Platelets: 289 10*3/uL (ref 140–400)
RBC: 3.79 10*6/uL — ABNORMAL LOW (ref 3.80–5.10)
RDW: 11.6 % (ref 11.0–15.0)
Total Lymphocyte: 30.4 %
WBC: 5.7 10*3/uL (ref 3.8–10.8)

## 2019-04-09 LAB — VITAMIN D 25 HYDROXY (VIT D DEFICIENCY, FRACTURES): Vit D, 25-Hydroxy: 35 ng/mL (ref 30–100)

## 2019-04-09 NOTE — Telephone Encounter (Signed)
Left message on pharmacy voice mail

## 2019-04-11 ENCOUNTER — Telehealth: Payer: Self-pay

## 2019-04-11 MED ORDER — TIROSINT 75 MCG PO CAPS: 1.0000 | ORAL_CAPSULE | Freq: Every day | ORAL | 1 refills | Status: DC

## 2019-04-11 NOTE — Telephone Encounter (Signed)
Pt has been notified of lab results and verbalized understanding, pt also states she can only take Tirosint only no generic and she states the generic version was sent to pharmacy, please resend prescription as Tirosint

## 2019-04-11 NOTE — Addendum Note (Signed)
Addended by: Steele Sizer F on: 04/11/2019 02:58 PM   Modules accepted: Orders

## 2019-04-11 NOTE — Telephone Encounter (Signed)
-----   Message from Delsa Grana, [place] sent at 04/09/2019 11:46 AM EST ----- Please notify pt of labwork  The TSH was abnormally low (lower than last labs) so it was appropriate to decrease her Tirosint dose - she needs to come in 6 weeks for TSH labs recheck only, please have her do an OV if she has any sx or concerns.  Other labs all looked great- urine was normal, kidney liver function and electrolytes and vitamin D were all normal, and she had no anemia

## 2019-05-28 ENCOUNTER — Other Ambulatory Visit: Payer: Self-pay

## 2019-05-28 DIAGNOSIS — E039 Hypothyroidism, unspecified: Secondary | ICD-10-CM

## 2019-05-29 LAB — TSH: TSH: 0.43 mIU/L (ref 0.40–4.50)

## 2019-06-07 DIAGNOSIS — G4733 Obstructive sleep apnea (adult) (pediatric): Secondary | ICD-10-CM | POA: Insufficient documentation

## 2019-06-07 DIAGNOSIS — F419 Anxiety disorder, unspecified: Secondary | ICD-10-CM | POA: Insufficient documentation

## 2019-06-17 ENCOUNTER — Other Ambulatory Visit: Payer: Self-pay

## 2019-06-17 DIAGNOSIS — F330 Major depressive disorder, recurrent, mild: Secondary | ICD-10-CM

## 2019-06-17 MED ORDER — ESCITALOPRAM OXALATE 10 MG PO TABS: 10.0000 mg | ORAL_TABLET | Freq: Every day | ORAL | 3 refills | Status: DC

## 2019-06-17 NOTE — Telephone Encounter (Signed)
Last office visit pertaining:8/18

## 2019-07-21 ENCOUNTER — Other Ambulatory Visit: Payer: Self-pay | Admitting: Family Medicine

## 2019-07-21 NOTE — Telephone Encounter (Signed)
Requested Prescriptions  Pending Prescriptions Disp Refills  . [place] [Pharmacy Med Name: TIROSINT 75 MCG CAPSULE] 30 capsule 1    Sig: TAKE 1 CAPSULE (75 MCG TOTAL) BY MOUTH DAILY BEFORE BREAKFAST.     Endocrinology:  Hypothyroid Agents Failed - 07/21/2019  9:41 AM      Failed - TSH needs to be rechecked within 3 months after an abnormal result. Refill until TSH is due.      Passed - TSH in normal range and within 360 days    TSH  Date Value Ref Range Status  05/28/2019 0.43 0.40 - 4.50 mIU/L Final         Passed - Valid encounter within last 12 months    Recent Outpatient Visits          3 months ago Urinary frequency   [facility] Delsa Grana, PA-C   4 months ago Urinary frequency   [facility] [place], Kristeen Miss, PA-C   4 months ago Acute cystitis without hematuria   [facility] Delsa Grana, PA-C   7 months ago Fatigue, unspecified type   [facility] Delsa Grana, PA-C   1 year ago Acquired hypothyroidism   [place], Satira Anis, MD      Future Appointments            In 2 weeks Delsa Grana, PA-C [facility], [facility]

## 2019-07-22 ENCOUNTER — Other Ambulatory Visit: Payer: Self-pay | Admitting: Family Medicine

## 2019-07-22 DIAGNOSIS — F330 Major depressive disorder, recurrent, mild: Secondary | ICD-10-CM

## 2019-08-05 ENCOUNTER — Encounter: Payer: Self-pay | Admitting: Family Medicine

## 2019-08-05 ENCOUNTER — Ambulatory Visit (INDEPENDENT_AMBULATORY_CARE_PROVIDER_SITE_OTHER): Payer: Managed Care, Other (non HMO) | Admitting: Family Medicine

## 2019-08-05 ENCOUNTER — Other Ambulatory Visit: Payer: Self-pay

## 2019-08-05 VITALS — BP 122/82 | HR 83 | Temp 98.1°F | Resp 14 | Ht 63.0 in | Wt 173.2 lb

## 2019-08-05 DIAGNOSIS — E669 Obesity, unspecified: Secondary | ICD-10-CM

## 2019-08-05 DIAGNOSIS — F330 Major depressive disorder, recurrent, mild: Secondary | ICD-10-CM

## 2019-08-05 DIAGNOSIS — E039 Hypothyroidism, unspecified: Secondary | ICD-10-CM | POA: Diagnosis not present

## 2019-08-05 DIAGNOSIS — E782 Mixed hyperlipidemia: Secondary | ICD-10-CM

## 2019-08-05 DIAGNOSIS — Z1231 Encounter for screening mammogram for malignant neoplasm of breast: Secondary | ICD-10-CM

## 2019-08-05 DIAGNOSIS — Z5181 Encounter for therapeutic drug level monitoring: Secondary | ICD-10-CM

## 2019-08-05 LAB — COMPLETE METABOLIC PANEL WITH GFR
AG Ratio: 1.6 (calc) (ref 1.0–2.5)
ALT: 15 U/L (ref 6–29)
AST: 16 U/L (ref 10–35)
Albumin: 4.2 g/dL (ref 3.6–5.1)
Alkaline phosphatase (APISO): 54 U/L (ref 37–153)
BUN: 18 mg/dL (ref 7–25)
CO2: 28 mmol/L (ref 20–32)
Calcium: 9.6 mg/dL (ref 8.6–10.4)
Chloride: 103 mmol/L (ref 98–110)
Creat: 0.81 mg/dL (ref 0.50–0.99)
GFR, Est African American: 87 mL/min/{1.73_m2} (ref >=60)
GFR, Est Non African American: 75 mL/min/{1.73_m2} (ref >=60)
Globulin: 2.6 g/dL (calc) (ref 1.9–3.7)
Glucose, Bld: 84 mg/dL (ref 65–139)
Potassium: 4.8 mmol/L (ref 3.5–5.3)
Sodium: 138 mmol/L (ref 135–146)
Total Bilirubin: 0.3 mg/dL (ref 0.2–1.2)
Total Protein: 6.8 g/dL (ref 6.1–8.1)

## 2019-08-05 LAB — LIPID PANEL
Cholesterol: 212 mg/dL — ABNORMAL HIGH (ref <200)
HDL: 59 mg/dL (ref >=50)
LDL Cholesterol (Calc): 131 mg/dL (calc) — ABNORMAL HIGH
Non-HDL Cholesterol (Calc): 153 mg/dL (calc) — ABNORMAL HIGH (ref <130)
Total CHOL/HDL Ratio: 3.6 (calc) (ref <5.0)
Triglycerides: 113 mg/dL (ref <150)

## 2019-08-05 LAB — TSH: TSH: 0.77 mIU/L (ref 0.40–4.50)

## 2019-08-05 NOTE — Patient Instructions (Signed)
[facility] at [facility],  [place]  [id number] Get Driving Directions Main: [phone]   [facility] check your thyroid, cholesterol and complete metabolic panel - kidney function, liver function electrolytes

## 2019-08-05 NOTE — Progress Notes (Signed)
Name: Annette Coleman   MRN: [id number]    DOB: [birth year]   Date:08/05/2019       Progress Note  Chief Complaint  Patient presents with  . Follow-up  . Depression  . Gastroesophageal Reflux  . Hyperlipidemia    stopped chol      Subjective:   Annette Coleman is a 69 y.o. female, presents to clinic for routine follow up on the conditions listed above.  Hyperlipidemia: Current Medication Regimen:   Pravastatin 20 mg stopped meds about a month ago due to SE/nightmares, labs today  Last Lipids: Lab Results  Component Value Date   CHOL 137 01/01/2019   HDL 58 01/01/2019   LDLCALC 63 01/01/2019   TRIG 84 01/01/2019   CHOLHDL 2.4 01/01/2019  - Current Diet:  Avoid sugar/ice cream, no fried foods - Denies: Chest pain, shortness of breath, myalgias. - Documented aortic atherosclerosis? Yes - Risk factors for atherosclerosis: hypercholesterolemia   Hypothyroidism: History: hypothyroidism Current Medication Regimen: 75 mcg tirosynt Takes medicine  Current Symptoms: denies fatigue, weight changes, heat/cold intolerance, bowel/skin changes or CVS symptoms - cognitive changes worked up from neuro - recheck levels today with dose decrease  Most recent results are below; we will be repeating labs today. Lab Results  Component Value Date   TSH 0.43 05/28/2019     Went to Dr. Melrose Nakayama for concerns of memory issues - she is taking nortryptaline 40 mg - she isn't sure when her follow up is, she thinks she's pending MRI approval or authorization.  MDD/anxiety on wellbutrin 300 mg daily and lexapro 10 mg She feels that her depression anxiety are well controlled and improved over the past several months.  She does not have any side effects or concerns with her current medications.  Starting the Lexapro it made a big difference. Depression screen [facility] 2/9 08/05/2019 04/05/2019 03/22/2019  Decreased Interest 0 0 0  Down, Depressed, Hopeless 0 0 0  PHQ - 2 Score 0 0 0  Altered sleeping 0 0  0  Tired, decreased energy 0 3 0  Change in appetite 0 0 0  Feeling bad or failure about yourself  0 0 0  Trouble concentrating 0 3 0  Moving slowly or fidgety/restless 0 0 0  Suicidal thoughts 0 0 0  PHQ-9 Score 0 6 0  Difficult doing work/chores Not difficult at all Somewhat difficult Not difficult at all     Patient Active Problem List   Diagnosis Date Noted  . History of colonic polyps   . Polyp of colon   . Gastroesophageal reflux disease 12/04/2018  . Mild episode of recurrent major depressive disorder ([place]) 12/04/2018  . Obesity (BMI 30.0-34.9) 05/23/2018  . Mixed hyperlipidemia 03/05/2018  . Current long-term use of postmenopausal hormone replacement therapy 06/10/2017  . Memory difficulties 05/04/2017  . Vitamin D deficiency 02/08/2017  . Hypothyroidism 11/08/2016    Past Surgical History:  Procedure Laterality Date  . ABDOMINAL HYSTERECTOMY    . APPENDECTOMY    . BUNIONECTOMY    . CHOLECYSTECTOMY    . COLONOSCOPY WITH PROPOFOL N/A 01/04/2019   Procedure: COLONOSCOPY WITH PROPOFOL;  Surgeon: Virgel Manifold, MD;  Location: [facility] ENDOSCOPY;  Service: Endoscopy;  Laterality: N/A;  . complete parotidectomy    . ESOPHAGOGASTRODUODENOSCOPY    . jaw arthroscopy    . TUBAL LIGATION      Family History  Problem Relation Age of Onset  . Stroke Mother   . Hypertension Mother   . Prostate cancer  Father   . Cancer Sister   . Hypertension Sister     Social History   Tobacco Use  . Smoking status: Former Smoker    Quit date: 2012    Years since quitting: 9.3  . Smokeless tobacco: Never Used  Substance Use Topics  . Alcohol use: Yes    Comment: rare  . Drug use: Not Currently      Current Outpatient Medications:  .  Apple Cider Vinegar 188 MG CAPS, Take by mouth., Disp: , Rfl:  .  buPROPion (WELLBUTRIN XL) 300 MG 24 hr tablet, TAKE 1 TABLET BY MOUTH EVERY DAY, Disp: 30 tablet, Rfl: 5 .  cholecalciferol (VITAMIN D3) 25 MCG (1000 UT) tablet, Take 1,000  Units by mouth daily., Disp: , Rfl:  .  cyanocobalamin 1000 MCG tablet, Take 1,000 mcg by mouth daily., Disp: , Rfl:  .  ELDERBERRY PO, Take 1 tablet by mouth daily., Disp: , Rfl:  .  escitalopram (LEXAPRO) 10 MG tablet, Take 1 tablet (10 mg total) by mouth daily., Disp: 90 tablet, Rfl: 3 .  nortriptyline (PAMELOR) 10 MG capsule, Take 20 mg by mouth at bedtime., Disp: , Rfl:  .  pantoprazole (PROTONIX) 20 MG tablet, Take 1 tablet (20 mg total) by mouth daily., Disp: 90 tablet, Rfl: 1 .  TIROSINT 75 MCG CAPS, TAKE 1 CAPSULE (75 MCG TOTAL) BY MOUTH DAILY BEFORE BREAKFAST., Disp: 30 capsule, Rfl: 1 .  estradiol (ESTRACE) 0.5 MG tablet, Take 1 tablet (0.5 mg total) by mouth every other day. For 1-2 weeks then decrease to three times a week (Patient not taking: Reported on 04/05/2019), Disp: 90 tablet, Rfl: 1 .  pravastatin (PRAVACHOL) 20 MG tablet, Take 1 tablet (20 mg total) by mouth at bedtime. For cholesterol (Patient not taking: Reported on 08/05/2019), Disp: 90 tablet, Rfl: 3  No Known Allergies  Chart Review Today: I personally reviewed active problem list, medication list, allergies, family history, social history, health maintenance, notes from last encounter, lab results, imaging with the patient/caregiver today.  Review of Systems  10 Systems reviewed and are negative for acute change except as noted in the HPI.   Objective:    Vitals:   08/05/19 0943  BP: 122/82  Pulse: 83  Resp: 14  Temp: 98.1 F (36.7 C)  SpO2: 97%  Weight: 173 lb 3.2 oz (78.6 kg)  Height: 5\' 3"  (1.6 m)    Body mass index is 30.68 kg/m.  Physical Exam   PHQ2/9: Depression screen [facility] 2/9 08/05/2019 04/05/2019 03/22/2019 12/04/2018 06/13/2018  Decreased Interest 0 0 0 1 0  Down, Depressed, Hopeless 0 0 0 0 0  PHQ - 2 Score 0 0 0 1 0  Altered sleeping 0 0 0 0 0  Tired, decreased energy 0 3 0 3 0  Change in appetite 0 0 0 1 0  Feeling bad or failure about yourself  0 0 0 0 0  Trouble concentrating 0 3  0 3 0  Moving slowly or fidgety/restless 0 0 0 0 0  Suicidal thoughts 0 0 0 0 0  PHQ-9 Score 0 6 0 8 0  Difficult doing work/chores Not difficult at all Somewhat difficult Not difficult at all Somewhat difficult Not difficult at all    phq 9 is neg, reviewed   Fall Risk: Fall Risk  08/05/2019 04/05/2019 03/22/2019 12/04/2018 06/13/2018  Falls in the past year? 0 0 0 0 0  Number falls in past yr: 0 0 0 0 -  Injury  with Fall? 0 0 0 0 -  Follow up - - - Falls evaluation completed -    Functional Status Survey: Is the patient deaf or have difficulty hearing?: No Does the patient have difficulty seeing, even when wearing glasses/contacts?: No Does the patient have difficulty concentrating, remembering, or making decisions?: No Does the patient have difficulty walking or climbing stairs?: No Does the patient have difficulty dressing or bathing?: No Does the patient have difficulty doing errands alone such as visiting a doctor's office or shopping?: No   Assessment & Plan:     ICD-10-CM   1. Mild episode of recurrent major depressive disorder (HCC)  F33.0    Improved and controlled symptoms with Wellbutrin and Lexapro 10 mg  2. Hypothyroidism, unspecified type  E03.9 TSH   No symptoms concerning for chemical hypothyroid we will check labs and send in refills, dose change according to lab results  3. Mixed hyperlipidemia  99991111 COMPLETE METABOLIC PANEL WITH GFR    Lipid panel   Patient was not able to tolerate pravastatin and discontinued it we will recheck labs today encouraged her to work on diet and exercise  4. Obesity (BMI 30.0-34.9)  E66.9   5. Medication monitoring encounter  [id number] COMPLETE METABOLIC PANEL WITH GFR    Lipid panel    TSH  6. Encounter for screening mammogram for breast cancer  Z12.31 MM 3D SCREEN BREAST BILATERAL     Return for 6 month CPE and routine f/up split 40 min.   Delsa Grana, PA-C 08/05/19 10:01 AM

## 2019-08-06 ENCOUNTER — Encounter: Payer: Self-pay | Admitting: Family Medicine

## 2019-08-09 MED ORDER — PRAVASTATIN SODIUM 10 MG PO TABS: 10.0000 mg | ORAL_TABLET | Freq: Every day | ORAL | 11 refills | Status: AC

## 2019-08-09 NOTE — Addendum Note (Signed)
Addended by: Delsa Grana on: 08/09/2019 05:03 PM   Modules accepted: Orders

## 2019-08-12 ENCOUNTER — Ambulatory Visit
Admission: RE | Admit: 2019-08-12 | Discharge: 2019-08-12 | Disposition: A | Discharge status: Home / Self Care | Payer: Managed Care, Other (non HMO) | Source: Ambulatory Visit | Attending: Family Medicine | Admitting: Family Medicine

## 2019-08-12 DIAGNOSIS — Z1231 Encounter for screening mammogram for malignant neoplasm of breast: Secondary | ICD-10-CM | POA: Diagnosis present

## 2019-08-14 ENCOUNTER — Other Ambulatory Visit: Payer: Self-pay | Admitting: Family Medicine

## 2019-08-14 DIAGNOSIS — K219 Gastro-esophageal reflux disease without esophagitis: Secondary | ICD-10-CM

## 2019-08-14 DIAGNOSIS — R1013 Epigastric pain: Secondary | ICD-10-CM

## 2019-08-26 DIAGNOSIS — R42 Dizziness and giddiness: Secondary | ICD-10-CM | POA: Insufficient documentation

## 2019-08-26 DIAGNOSIS — H8110 Benign paroxysmal vertigo, unspecified ear: Secondary | ICD-10-CM | POA: Insufficient documentation

## 2019-10-01 ENCOUNTER — Other Ambulatory Visit: Payer: Self-pay | Admitting: Neurology

## 2019-10-01 DIAGNOSIS — R413 Other amnesia: Secondary | ICD-10-CM

## 2019-10-02 ENCOUNTER — Encounter: Payer: Self-pay | Admitting: Internal Medicine

## 2019-10-02 ENCOUNTER — Other Ambulatory Visit: Payer: Self-pay

## 2019-10-02 ENCOUNTER — Ambulatory Visit (INDEPENDENT_AMBULATORY_CARE_PROVIDER_SITE_OTHER): Payer: Managed Care, Other (non HMO) | Admitting: Internal Medicine

## 2019-10-02 VITALS — BP 150/90 | HR 74 | Temp 99.2°F | Resp 16 | Ht 62.0 in | Wt 175.5 lb

## 2019-10-02 DIAGNOSIS — R030 Elevated blood-pressure reading, without diagnosis of hypertension: Secondary | ICD-10-CM

## 2019-10-02 DIAGNOSIS — R109 Unspecified abdominal pain: Secondary | ICD-10-CM

## 2019-10-02 DIAGNOSIS — M545 Low back pain, unspecified: Secondary | ICD-10-CM

## 2019-10-02 LAB — POCT URINALYSIS DIPSTICK
Appearance: NORMAL
Bilirubin, UA: NEGATIVE
Blood, UA: NEGATIVE
Glucose, UA: NEGATIVE
Ketones, UA: NEGATIVE
Nitrite, UA: NEGATIVE
Odor: NORMAL
Protein, UA: POSITIVE — AB
Spec Grav, UA: 1.01 (ref 1.010–1.025)
Urobilinogen, UA: 0.2 E.U./dL
pH, UA: 7.5 (ref 5.0–8.0)

## 2019-10-02 NOTE — Progress Notes (Signed)
Patient ID: Annette Coleman, female    DOB: 03-06-1951, 69 y.o.   MRN: [id number]  PCP: Delsa Grana, PA-C  Chief Complaint  Patient presents with  . Back Pain    starting last week lower back pain, only happens at night, she is supsicious of kidney stones, worse on right side today    Subjective:   Annette Coleman is a 69 y.o. female, presents to clinic with CC of the following:  Chief Complaint  Patient presents with  . Back Pain    starting last week lower back pain, only happens at night, she is supsicious of kidney stones, worse on right side today    HPI:  Patient is a 69 year old female patient of Delsa Grana She was just seen by neurology 09/30/2019 with an MRI ordered without contrast to evaluate early onset memory difficulty.  Also anxiety noted.  They recommended continuing the Lexapro, nortriptyline, and meclizine products, also taking Wellbutrin.  She follows up today with low back pain.  It started about a week ago, is intermittent in that it happens mostly at night, with no one-time trauma before it started.  He fell on both sides of the back, and noted from the CVA regions bilaterally down the lower back is where she felt it.  It was at its worst 2 nights ago.  She denied any burning with urination, no blood in the urine, no frequency.  She states she has had UTIs in the past and this was not like that.  She has no history of kidney stones, although she is concerned it may be a kidney stone.  Was not having fevers. She notes that the symptoms have really calmed down, and she slept through the night last night.  Still feels some soreness more than marked pain in the right CVA region of her back, but otherwise much improved.  Patient Active Problem List   Diagnosis Date Noted  . History of colonic polyps   . Polyp of colon   . Gastroesophageal reflux disease 12/04/2018  . Mild episode of recurrent major depressive disorder ([place]) 12/04/2018  . Obesity (BMI 30.0-34.9)  05/23/2018  . Mixed hyperlipidemia 03/05/2018  . Current long-term use of postmenopausal hormone replacement therapy 06/10/2017  . Memory difficulties 05/04/2017  . Vitamin D deficiency 02/08/2017  . Hypothyroidism 11/08/2016      Current Outpatient Medications:  .  Apple Cider Vinegar 188 MG CAPS, Take by mouth., Disp: , Rfl:  .  buPROPion (WELLBUTRIN XL) 300 MG 24 hr tablet, TAKE 1 TABLET BY MOUTH EVERY DAY, Disp: 30 tablet, Rfl: 5 .  cholecalciferol (VITAMIN D3) 25 MCG (1000 UT) tablet, Take 1,000 Units by mouth daily., Disp: , Rfl:  .  cyanocobalamin 1000 MCG tablet, Take 1,000 mcg by mouth daily., Disp: , Rfl:  .  ELDERBERRY PO, Take 1 tablet by mouth daily., Disp: , Rfl:  .  escitalopram (LEXAPRO) 10 MG tablet, Take 1 tablet (10 mg total) by mouth daily., Disp: 90 tablet, Rfl: 3 .  nortriptyline (PAMELOR) 10 MG capsule, Take 20 mg by mouth at bedtime., Disp: , Rfl:  .  pantoprazole (PROTONIX) 20 MG tablet, TAKE 1 TABLET BY MOUTH EVERY DAY, Disp: 90 tablet, Rfl: 3 .  pravastatin (PRAVACHOL) 10 MG tablet, Take 1 tablet (10 mg total) by mouth daily., Disp: 30 tablet, Rfl: 11 .  TIROSINT 75 MCG CAPS, TAKE 1 CAPSULE (75 MCG TOTAL) BY MOUTH DAILY BEFORE BREAKFAST., Disp: 30 capsule, Rfl: 1   No Known  Allergies   Past Surgical History:  Procedure Laterality Date  . ABDOMINAL HYSTERECTOMY    . APPENDECTOMY    . BUNIONECTOMY    . CHOLECYSTECTOMY    . COLONOSCOPY WITH PROPOFOL N/A 01/04/2019   Procedure: COLONOSCOPY WITH PROPOFOL;  Surgeon: Virgel Manifold, MD;  Location: [facility] ENDOSCOPY;  Service: Endoscopy;  Laterality: N/A;  . complete parotidectomy    . ESOPHAGOGASTRODUODENOSCOPY    . jaw arthroscopy    . TUBAL LIGATION       Family History  Problem Relation Age of Onset  . Stroke Mother   . Hypertension Mother   . Prostate cancer Father   . Cancer Sister   . Hypertension Sister      Social History   Tobacco Use  . Smoking status: Former Smoker    Quit date:  2012    Years since quitting: 9.4  . Smokeless tobacco: Never Used  Substance Use Topics  . Alcohol use: Yes    Comment: rare    With staff assistance, above reviewed with the patient today.  ROS: As per HPI, otherwise no specific complaints on a limited and focused system review   No results found for this or any previous visit (from the past 72 hour(s)).   PHQ2/9: Depression screen [facility] 2/9 10/02/2019 08/05/2019 04/05/2019 03/22/2019 12/04/2018  Decreased Interest 0 0 0 0 1  Down, Depressed, Hopeless 0 0 0 0 0  PHQ - 2 Score 0 0 0 0 1  Altered sleeping 0 0 0 0 0  Tired, decreased energy 0 0 3 0 3  Change in appetite 0 0 0 0 1  Feeling bad or failure about yourself  0 0 0 0 0  Trouble concentrating 0 0 3 0 3  Moving slowly or fidgety/restless 0 0 0 0 0  Suicidal thoughts 0 0 0 0 0  PHQ-9 Score 0 0 6 0 8  Difficult doing work/chores Not difficult at all Not difficult at all Somewhat difficult Not difficult at all Somewhat difficult   PHQ-2/9 Result is neg  Fall Risk: Fall Risk  10/02/2019 08/05/2019 04/05/2019 03/22/2019 12/04/2018  Falls in the past year? 0 0 0 0 0  Number falls in past yr: 0 0 0 0 0  Injury with Fall? 0 0 0 0 0  Follow up - - - - Falls evaluation completed      Objective:   Vitals:   10/02/19 1121  BP: (!) 150/90  Pulse: 74  Resp: 16  Temp: 99.2 F (37.3 C)  TempSrc: Temporal  SpO2: 100%  Weight: 175 lb 8 oz (79.6 kg)  Height: 5\' 2"  (1.575 m)    Body mass index is 32.1 kg/m.   BP Readings from Last 3 Encounters:  10/02/19 (!) 150/90  08/05/19 122/82  04/05/19 130/80  Her blood pressure at the recent neurology visit was 148/88 Recheck of her blood pressure today by myself was 154/84 on the left Physical Exam   NAD, masked, not ill-appearing HEENT - [place]/AT, sclera anicteric, PERRL, EOMI, conj - non-inj'ed, pharynx clear Neck - supple, no adenopathy,  Car - RRR without m/g/r Pulm- RR and effort normal at rest, CTA without wheeze or rales Abd  - soft, NT, ND,  no masses, bowel sounds positive Back - no focal CVA tenderness, still noted some soreness feeling in the right CVA region and slightly inferior, nontender in the lumbar muscles bilateral, nontender over the lower thoracic lumbar spine. Ext - no LE edema,  Neuro/psychiatric -  affect was not flat, appropriate with conversation  Alert and oriented  Grossly non-focal   Speech normal   Results for orders placed or performed in visit on 08/05/19  COMPLETE METABOLIC PANEL WITH GFR  Result Value Ref Range   Glucose, Bld 84 65 - 139 mg/dL   BUN 18 7 - 25 mg/dL   Creat 0.81 0.50 - 0.99 mg/dL   GFR, Est Non African American 75 > OR = 60 mL/min/1.65m2   GFR, Est African American 87 > OR = 60 mL/min/1.37m2   BUN/Creatinine Ratio NOT APPLICABLE 6 - 22 (calc)   Sodium 138 135 - 146 mmol/L   Potassium 4.8 3.5 - 5.3 mmol/L   Chloride 103 98 - 110 mmol/L   CO2 28 20 - 32 mmol/L   Calcium 9.6 8.6 - 10.4 mg/dL   Total Protein 6.8 6.1 - 8.1 g/dL   Albumin 4.2 3.6 - 5.1 g/dL   Globulin 2.6 1.9 - 3.7 g/dL (calc)   AG Ratio 1.6 1.0 - 2.5 (calc)   Total Bilirubin 0.3 0.2 - 1.2 mg/dL   Alkaline phosphatase (APISO) 54 37 - 153 U/L   AST 16 10 - 35 U/L   ALT 15 6 - 29 U/L  Lipid panel  Result Value Ref Range   Cholesterol 212 (H) <200 mg/dL   HDL 59 > OR = 50 mg/dL   Triglycerides 113 <150 mg/dL   LDL Cholesterol (Calc) 131 (H) mg/dL (calc)   Total CHOL/HDL Ratio 3.6 <5.0 (calc)   Non-HDL Cholesterol (Calc) 153 (H) <130 mg/dL (calc)  TSH  Result Value Ref Range   TSH 0.77 0.40 - 4.50 mIU/L   Urine dip had trace leukocytes, trace protein, otherwise negative with no blood    Assessment & Plan:  1. Flank pain Discussed some of the possibilities with the patient today, with her symptoms bilateral when it was most problematic a couple nights ago.  Also involved more of the back pain just the CVA area, and seems less likely for a kidney stones source.  Did note often the kidney stones  can dissolve and improve without obviously passing a stone which she has not to date.  Possible low back muscle strain/spasm which can be quite painful.  Doubt a infectious concern like a pyelonephritis also noted.  Has significantly improved. The urine dip was checked today which showed no blood. Noting the trace leukocytes, will send for a complete urinalysis and culture to ensure no infectious concerns. Agreed to monitor presently, and if symptoms increasing again, should follow-up. - POCT Urinalysis Dipstick - Urinalysis, Complete - Urine Culture  2. Acute bilateral low back pain without sciatica As above.  Has improved  3. Elevated blood pressure reading Noted her blood pressure was slightly elevated today, mostly the systolic reading, and noted the many things that can affect that number.  Stressors have been increased in the recent past, especially with these symptoms noted.  Pain will also increase blood pressure some.  We will continue to monitor blood pressure presently.  Await the urine results, and do expect them to be okay and not consistent with infection. She will follow-up if symptoms not resolving, and again increasing.     Towanda Malkin, MD 10/02/19 11:28 AM

## 2019-10-03 ENCOUNTER — Ambulatory Visit: Payer: Managed Care, Other (non HMO) | Admitting: Internal Medicine

## 2019-10-03 LAB — URINALYSIS, COMPLETE
Bacteria, UA: NONE SEEN /HPF
Bilirubin Urine: NEGATIVE
Glucose, UA: NEGATIVE
Hgb urine dipstick: NEGATIVE
Hyaline Cast: NONE SEEN /LPF
Ketones, ur: NEGATIVE
Leukocytes,Ua: NEGATIVE
Nitrite: NEGATIVE
Protein, ur: NEGATIVE
Specific Gravity, Urine: 1.019 (ref 1.001–1.03)
Squamous Epithelial / HPF: NONE SEEN /HPF (ref <=5)
WBC, UA: NONE SEEN /HPF (ref 0–5)
pH: 7.5 (ref 5.0–8.0)

## 2019-10-03 LAB — URINE CULTURE
MICRO NUMBER:: 10599494
SPECIMEN QUALITY:: ADEQUATE

## 2019-10-10 ENCOUNTER — Other Ambulatory Visit: Payer: Self-pay | Admitting: Family Medicine

## 2019-10-11 MED ORDER — TIROSINT 75 MCG PO CAPS: 1.0000 | ORAL_CAPSULE | Freq: Every day | ORAL | 11 refills | Status: AC

## 2019-10-11 NOTE — Addendum Note (Signed)
Addended by: Delsa Grana on: 10/11/2019 06:48 PM   Modules accepted: Orders

## 2019-10-16 ENCOUNTER — Ambulatory Visit
Admission: RE | Admit: 2019-10-16 | Discharge: 2019-10-16 | Disposition: A | Discharge status: Home / Self Care | Payer: Managed Care, Other (non HMO) | Source: Ambulatory Visit | Attending: Neurology | Admitting: Neurology

## 2019-10-16 ENCOUNTER — Other Ambulatory Visit: Payer: Self-pay

## 2019-10-16 DIAGNOSIS — R413 Other amnesia: Secondary | ICD-10-CM | POA: Diagnosis present

## 2019-12-10 ENCOUNTER — Ambulatory Visit (INDEPENDENT_AMBULATORY_CARE_PROVIDER_SITE_OTHER): Payer: Managed Care, Other (non HMO) | Admitting: Dermatology

## 2019-12-10 ENCOUNTER — Encounter: Payer: Self-pay | Admitting: Dermatology

## 2019-12-10 ENCOUNTER — Other Ambulatory Visit: Payer: Self-pay

## 2019-12-10 DIAGNOSIS — L304 Erythema intertrigo: Secondary | ICD-10-CM

## 2019-12-10 DIAGNOSIS — I872 Venous insufficiency (chronic) (peripheral): Secondary | ICD-10-CM | POA: Diagnosis not present

## 2019-12-10 MED ORDER — HYDROCORTISONE 2.5 % EX CREA: TOPICAL_CREAM | CUTANEOUS | 2 refills | Status: DC

## 2019-12-10 MED ORDER — TRIAMCINOLONE ACETONIDE 0.1 % EX CREA
TOPICAL_CREAM | CUTANEOUS | 2 refills | Status: AC
Start: 2019-12-10 — End: ?

## 2019-12-10 MED ORDER — KETOCONAZOLE 2 % EX CREA
TOPICAL_CREAM | CUTANEOUS | 2 refills | Status: DC
Start: 2019-12-10 — End: 2019-12-17

## 2019-12-10 NOTE — Patient Instructions (Signed)
Recommend daily broad spectrum sunscreen SPF 30+ to sun-exposed areas, reapply every 2 hours as needed. Call for new or changing lesions.  

## 2019-12-10 NOTE — Progress Notes (Addendum)
   New Patient Visit  Subjective  Annette Coleman is a 69 y.o. female who presents for the following: New Patient (Initial Visit).  Patient presents today as a new patient, does have a few areas of concern on her R lower leg, also under breasts . Patient states that she does not have a h/o skin cancer  The following portions of the chart were reviewed this encounter and updated as appropriate:      Review of Systems:  No other skin or systemic complaints except as noted in HPI or Assessment and Plan.  Objective  Well appearing patient in no apparent distress; mood and affect are within normal limits.  A focused examination was performed including R lower leg, under breasts. Relevant physical exam findings are noted in the Assessment and Plan.  Objective  Right Inframammary Fold: Pink patch with satellite papules  Objective  Right Lower Leg - Anterior: Trace pitting edema at ankle Yellow brown erythema with peau d'orange texture and focal mild scale and excoriations on R pretibia   Assessment & Plan  Erythema intertrigo Right Inframammary Fold  With Candida Start ketoconazole cream apply BID to affected areas under breast x 4 weeks, then 3x/wk Start HC 2.5% cream apply to affected areas under breast once to twice daily as needed for itch  Recommend OTC Zeasorb AF powder  ketoconazole (NIZORAL) 2 % cream - Right Inframammary Fold  hydrocortisone 2.5 % cream - Right Inframammary Fold  Venous stasis dermatitis, unspecified laterality Right Lower Leg - Anterior  Vs Nummular Dermatitis Wear compression stocking daily, esp while working, leg measurements Ankle 22 cm, Calf 40 cm, and Length 43 cm, [place] pamphlet given Start TMC 0.1% cream apply to affected areas once to twice daily on Lower leg.  Avoid applying to face, groin, and axilla. Use as directed. Risk of skin atrophy with long-term use reviewed.   Topical steroids (such as triamcinolone, fluocinolone,  fluocinonide, mometasone, clobetasol, halobetasol, betamethasone, hydrocortisone) can cause thinning and lightening of the skin if they are used for too long in the same area. Your physician has selected the right strength medicine for your problem and area affected on the body. Please use your medication only as directed by your physician to prevent side effects.     triamcinolone cream (KENALOG) 0.1 % - Right Lower Leg - Anterior  Return in about 1 month (around 01/10/2020) for Stasis derm and intertrigo.  Marene Lenz, CMA, am acting as scribe for Brendolyn Patty, MD .  Documentation: I have reviewed the above documentation for accuracy and completeness, and I agree with the above.  Brendolyn Patty MD

## 2019-12-17 ENCOUNTER — Other Ambulatory Visit: Payer: Self-pay

## 2019-12-17 ENCOUNTER — Ambulatory Visit (INDEPENDENT_AMBULATORY_CARE_PROVIDER_SITE_OTHER): Payer: Managed Care, Other (non HMO) | Admitting: Dermatology

## 2019-12-17 DIAGNOSIS — L304 Erythema intertrigo: Secondary | ICD-10-CM

## 2019-12-17 DIAGNOSIS — R21 Rash and other nonspecific skin eruption: Secondary | ICD-10-CM | POA: Diagnosis not present

## 2019-12-17 DIAGNOSIS — I872 Venous insufficiency (chronic) (peripheral): Secondary | ICD-10-CM

## 2019-12-17 MED ORDER — CLOBETASOL PROPIONATE 0.05 % EX CREA: 1.0000 "application " | TOPICAL_CREAM | Freq: Two times a day (BID) | CUTANEOUS | 1 refills | Status: AC

## 2019-12-17 NOTE — Progress Notes (Signed)
   Follow-Up Visit   Subjective  Annette Coleman is a 69 y.o. female who presents for the following: Rash.  Patient here today for rash that has spread to the breast and abdomen. She is using ketoconazole and hydrocortisone 2.5% twice daily. It is very itchy and spreading. She also tried the [facility] 0.1% cream. Neither is helping.  The rash under her breast has improved. She was also seen last week for stasis at right lower leg and is using [facility] 0.1% cream. Patient thinks the leg is about the same as last week. She has had swelling since the rash came up. Patient advises rashes on leg and trunk appeared following her first Covid vaccination.  She has not taken any new medications.  She has not been outside working in yard. She doesn't have a h/o eczema.  The following portions of the chart were reviewed this encounter and updated as appropriate:      Review of Systems:  No other skin or systemic complaints except as noted in HPI or Assessment and Plan.  Objective  Well appearing patient in no apparent distress; mood and affect are within normal limits.  A focused examination was performed including trunk, extremities. Relevant physical exam findings are noted in the Assessment and Plan.  Objective  Right Inframammary Fold: Clear  Objective  Right Breast, abdomen: Confluent edematous pink patches with surrounding scattered pink papules,no scale - right breast and abdomen.    Objective  Right Lower Leg - Anterior: Pink brown smooth plaque at right pretibial with surrounding dyspigmentation Minimal edema   Assessment & Plan  Erythema intertrigo Right Inframammary Fold  Improved Continue ketoconazole cream qd and HC 2.5% cream prn flares   Rash and other nonspecific skin eruption Right Breast, abdomen  Allergic contact dermatitis (unclear etiology) Vs Drug Eruption  Start clobetasol 0.05% cream twice daily to leg, abdomen and chest. Avoid applying to face, groin, and axilla.  Use as directed. Risk of skin atrophy with long-term use reviewed.  Start OTC Zyrtec 10 mg po qAM and Benadryl 25 mg PO qhs  Consider prednisone taper if topical clobetasol not helping. Discussed potential s.e. of prednisone in detail and pt prefers to try topical first.  Topical steroids (such as triamcinolone, fluocinolone, fluocinonide, mometasone, clobetasol, halobetasol, betamethasone, hydrocortisone) can cause thinning and lightening of the skin if they are used for too long in the same area. Your physician has selected the right strength medicine for your problem and area affected on the body. Please use your medication only as directed by your physician to prevent side effects.   Patient scheduled for 2nd vaccine this Sunday but she was advised that she should wait until rash is cleared up before getting the 2nd shot since rash appears to be an allergic reaction and could be related to the vaccine.     clobetasol cream (TEMOVATE) 0.05 % - Right Breast, abdomen  Venous stasis dermatitis of right lower extremity Right Lower Leg - Anterior  Vrs LSC, No improvement with [facility] cream  Start Clobetasol cream bid  Return in 3 weeks (on 01/07/2020) for f/up rash.  Graciella Belton, RMA, am acting as scribe for Brendolyn Patty, MD . Documentation: I have reviewed the above documentation for accuracy and completeness, and I agree with the above.  Brendolyn Patty MD

## 2019-12-17 NOTE — Patient Instructions (Addendum)
Topical steroids (such as triamcinolone, fluocinolone, fluocinonide, mometasone, clobetasol, halobetasol, betamethasone, hydrocortisone) can cause thinning and lightening of the skin if they are used for too long in the same area. Your physician has selected the right strength medicine for your problem and area affected on the body. Please use your medication only as directed by your physician to prevent side effects.   Recommend Eucerin 12  Hour Itch Relief Lotion with menthol.

## 2020-01-08 ENCOUNTER — Encounter: Payer: Self-pay | Admitting: Dermatology

## 2020-01-08 ENCOUNTER — Other Ambulatory Visit: Payer: Self-pay

## 2020-01-08 ENCOUNTER — Ambulatory Visit (INDEPENDENT_AMBULATORY_CARE_PROVIDER_SITE_OTHER): Payer: Managed Care, Other (non HMO) | Admitting: Dermatology

## 2020-01-08 DIAGNOSIS — I872 Venous insufficiency (chronic) (peripheral): Secondary | ICD-10-CM

## 2020-01-08 DIAGNOSIS — L304 Erythema intertrigo: Secondary | ICD-10-CM | POA: Diagnosis not present

## 2020-01-08 DIAGNOSIS — R21 Rash and other nonspecific skin eruption: Secondary | ICD-10-CM | POA: Diagnosis not present

## 2020-01-08 NOTE — Patient Instructions (Addendum)
Topical steroids (such as triamcinolone, fluocinolone, fluocinonide, mometasone, clobetasol, halobetasol, betamethasone, hydrocortisone) can cause thinning and lightening of the skin if they are used for too long in the same area. Your physician has selected the right strength medicine for your problem and area affected on the body. Please use your medication only as directed by your physician to prevent side effects.   Gentle Skin Care Guide  1. Bathe no more than once a day.  2. Avoid bathing in hot water  3. Use a mild soap like Dove, Vanicream, Cetaphil, CeraVe. Can use Lever 2000 or Cetaphil antibacterial soap  4. Use soap only where you need it. On most days, use it under your arms, between your legs, and on your feet. Let the water rinse other areas unless visibly dirty.  5. When you get out of the bath/shower, use a towel to gently blot your skin dry, don't rub it.  6. While your skin is still a little damp, apply a moisturizing cream such as Vanicream, CeraVe, Cetaphil, Eucerin, Sarna lotion or plain Vaseline Jelly. For hands apply Neutrogena [place] Hand Cream or Excipial Hand Cream.  7. Reapply moisturizer any time you start to itch or feel dry.  8. Sometimes using free and clear laundry detergents can be helpful. Fabric softener sheets should be avoided. Downy Free & Gentle liquid, or any liquid fabric softener that is free of dyes and perfumes, it acceptable to use  9. If your doctor has given you prescription creams you may apply moisturizers over them

## 2020-01-08 NOTE — Progress Notes (Signed)
   Follow-Up Visit   Subjective  Annette Coleman is a 69 y.o. female who presents for the following: Rash (3-4 week recheck. Rash on chest, abdomen and right lower leg. Has been using Clobetasol cream, Rx'd last visit and has improved. ).    The following portions of the chart were reviewed this encounter and updated as appropriate:     Review of Systems: No other skin or systemic complaints except as noted in HPI or Assessment and Plan.  Objective  Well appearing patient in no apparent distress; mood and affect are within normal limits.  A focused examination was performed including face, chest, abdomen, right lower leg. Relevant physical exam findings are noted in the Assessment and Plan.  Objective  Right Chest, abdomen: Healing excoriations at right breast. Abdomen clear  Objective  Right Lower Leg - Anterior: Mild erythema at right lower leg  Objective  Right Inframammary Fold: Clear today  Assessment & Plan  Rash Right Chest, abdomen  resolved Vaseline Jelly or Polysporin (if not allergic) to excoriations BID until healed.  Use Clobetasol PRN flares.  Discussed with patient to go ahead with 2nd COVID vaccine if she feels comfortable doing it. Advised rash could recur.   Venous stasis dermatitis of right lower extremity Right Lower Leg - Anterior  Improved D/C Clobetsol cream, use only PRN worse flares. Continue daily moisturizer  Recommend daily compression stockings if swelling worsens.  Elevate legs as much as possible.  Avoid salt/sodium rich foods.   Erythema intertrigo Right Inframammary Fold  Clear.  Resume Ketoconazole 2% and HC 2.5% cream BID PRN flares  Return if symptoms worsen or fail to improve.   I, Emelia Salisbury, CMA, am acting as scribe for Brendolyn Patty, MD.  Documentation: I have reviewed the above documentation for accuracy and completeness, and I agree with the above.  Brendolyn Patty MD

## 2020-01-20 ENCOUNTER — Other Ambulatory Visit: Payer: Self-pay | Admitting: Family Medicine

## 2020-01-20 DIAGNOSIS — F330 Major depressive disorder, recurrent, mild: Secondary | ICD-10-CM

## 2020-02-04 ENCOUNTER — Encounter: Payer: Self-pay | Admitting: Family Medicine

## 2020-02-04 ENCOUNTER — Ambulatory Visit (INDEPENDENT_AMBULATORY_CARE_PROVIDER_SITE_OTHER): Payer: Managed Care, Other (non HMO) | Admitting: Family Medicine

## 2020-02-04 ENCOUNTER — Other Ambulatory Visit: Payer: Self-pay

## 2020-02-04 VITALS — BP 120/76 | HR 77 | Temp 98.4°F | Resp 16 | Ht 62.0 in | Wt 180.5 lb

## 2020-02-04 DIAGNOSIS — F330 Major depressive disorder, recurrent, mild: Secondary | ICD-10-CM | POA: Diagnosis not present

## 2020-02-04 DIAGNOSIS — R21 Rash and other nonspecific skin eruption: Secondary | ICD-10-CM

## 2020-02-04 DIAGNOSIS — M25551 Pain in right hip: Secondary | ICD-10-CM

## 2020-02-04 DIAGNOSIS — Z0000 Encounter for general adult medical examination without abnormal findings: Secondary | ICD-10-CM | POA: Diagnosis not present

## 2020-02-04 DIAGNOSIS — E039 Hypothyroidism, unspecified: Secondary | ICD-10-CM | POA: Diagnosis not present

## 2020-02-04 DIAGNOSIS — E669 Obesity, unspecified: Secondary | ICD-10-CM

## 2020-02-04 DIAGNOSIS — E782 Mixed hyperlipidemia: Secondary | ICD-10-CM | POA: Diagnosis not present

## 2020-02-04 DIAGNOSIS — M25561 Pain in right knee: Secondary | ICD-10-CM

## 2020-02-04 DIAGNOSIS — Z1231 Encounter for screening mammogram for malignant neoplasm of breast: Secondary | ICD-10-CM

## 2020-02-04 DIAGNOSIS — K219 Gastro-esophageal reflux disease without esophagitis: Secondary | ICD-10-CM

## 2020-02-04 DIAGNOSIS — Z5181 Encounter for therapeutic drug level monitoring: Secondary | ICD-10-CM

## 2020-02-04 DIAGNOSIS — R300 Dysuria: Secondary | ICD-10-CM

## 2020-02-04 LAB — POCT URINALYSIS DIPSTICK
Bilirubin, UA: NEGATIVE
Blood, UA: NEGATIVE
Glucose, UA: NEGATIVE
Ketones, UA: NEGATIVE
Leukocytes, UA: NEGATIVE
Nitrite, UA: NEGATIVE
Protein, UA: NEGATIVE
Spec Grav, UA: 1.025 (ref 1.010–1.025)
Urobilinogen, UA: NEGATIVE E.U./dL — AB
pH, UA: 5 (ref 5.0–8.0)

## 2020-02-04 NOTE — Patient Instructions (Signed)
Health Maintenance  Topic Date Due  . Flu Shot  Never done  . Tetanus Vaccine  08/04/2020*  .  Hepatitis C: One time screening is recommended by Center for Disease Control  (CDC) for  adults born from [birth year] through 1964.   08/04/2020*  . Pneumonia vaccines (1 of 2 - PCV13) 08/04/2020*  . Mammogram  08/11/2020  . Colon Cancer Screening  01/04/2024  . DEXA scan (bone density measurement)  Completed  . COVID-19 Vaccine  Discontinued  *Topic was postponed. The date shown is not the original due date.   [facility] [address] [place],  [place]  [address] Get Driving Directions Main: [phone] You can call and arrange your mammogram for next April - I have put in order today   Recommend getting Pneumonia vaccines, COVID vaccines, annual flu shot You can do Bone Density every 2-3 years if needed  I recommend eye visit once a year I recommend Dental visit every 6 months I recommend 30 minutes of exercise 5 days a week as tolerated Eat fresh fruit and veggies daily You will be contacted with lab results   Health Maintenance Due  Topic Date Due  . INFLUENZA VACCINE  Never done     Immunization History  Administered Date(s) Administered  . Zoster Recombinat (Shingrix) 01/22/2019    See handout below for general health maintenance information:   Preventive Care 65 Years and Older, Female Preventive care refers to lifestyle choices and visits with your health care provider that can promote health and wellness. This includes:  A yearly physical exam. This is also called an annual well check.  Regular dental and eye exams.  Immunizations.  Screening for certain conditions.  Healthy lifestyle choices, such as diet and exercise. What can I expect for my preventive care visit? Physical exam Your health care provider will check:  Height and weight. These may be used to calculate body mass index (BMI), which is a measurement that tells  if you are at a healthy weight.  Heart rate and blood pressure.  Your skin for abnormal spots. Counseling Your health care provider may ask you questions about:  Alcohol, tobacco, and drug use.  Emotional well-being.  Home and relationship well-being.  Sexual activity.  Eating habits.  History of falls.  Memory and ability to understand (cognition).  Work and work Astronomer.  Pregnancy and menstrual history. What immunizations do I need?  Influenza (flu) vaccine  This is recommended every year. Tetanus, diphtheria, and pertussis (Tdap) vaccine  You may need a Td booster every 10 years. Varicella (chickenpox) vaccine  You may need this vaccine if you have not already been vaccinated. Zoster (shingles) vaccine  You may need this after age 24. Pneumococcal conjugate (PCV13) vaccine  One dose is recommended after age 55. Pneumococcal polysaccharide (PPSV23) vaccine  One dose is recommended after age 40. Measles, mumps, and rubella (MMR) vaccine  You may need at least one dose of MMR if you were born in 1957 or later. You may also need a second dose. Meningococcal conjugate (MenACWY) vaccine  You may need this if you have certain conditions. Hepatitis A vaccine  You may need this if you have certain conditions or if you travel or work in places where you may be exposed to hepatitis A. Hepatitis B vaccine  You may need this if you have certain conditions or if you travel or work in places where you may be exposed to hepatitis B. Haemophilus  influenzae type b (Hib) vaccine  You may need this if you have certain conditions. You may receive vaccines as individual doses or as more than one vaccine together in one shot (combination vaccines). Talk with your health care provider about the risks and benefits of combination vaccines. What tests do I need? Blood tests  Lipid and cholesterol levels. These may be checked every 5 years, or more frequently depending on  your overall health.  Hepatitis C test.  Hepatitis B test. Screening  Lung cancer screening. You may have this screening every year starting at age 31 if you have a 30-pack-year history of smoking and currently smoke or have quit within the past 15 years.  Colorectal cancer screening. All adults should have this screening starting at age 46 and continuing until age 25. Your health care provider may recommend screening at age 56 if you are at increased risk. You will have tests every 1-10 years, depending on your results and the type of screening test.  Diabetes screening. This is done by checking your blood sugar (glucose) after you have not eaten for a while (fasting). You may have this done every 1-3 years.  Mammogram. This may be done every 1-2 years. Talk with your health care provider about how often you should have regular mammograms.  BRCA-related cancer screening. This may be done if you have a family history of breast, ovarian, tubal, or peritoneal cancers. Other tests  Sexually transmitted disease (STD) testing.  Bone density scan. This is done to screen for osteoporosis. You may have this done starting at age 46. Follow these instructions at home: Eating and drinking  Eat a diet that includes fresh fruits and vegetables, whole grains, lean protein, and low-fat dairy products. Limit your intake of foods with high amounts of sugar, saturated fats, and salt.  Take vitamin and mineral supplements as recommended by your health care provider.  Do not drink alcohol if your health care provider tells you not to drink.  If you drink alcohol: ? Limit how much you have to 0-1 drink a day. ? Be aware of how much alcohol is in your drink. In the [place] one drink equals one 12 oz bottle of beer (355 mL), one 5 oz glass of wine (148 mL), or one 1 oz glass of hard liquor (44 mL). Lifestyle  Take daily care of your teeth and gums.  Stay active. Exercise for at least 30 minutes on 5 or  more days each week.  Do not use any products that contain nicotine or tobacco, such as cigarettes, e-cigarettes, and chewing tobacco. If you need help quitting, ask your health care provider.  If you are sexually active, practice safe sex. Use a condom or other form of protection in order to prevent STIs (sexually transmitted infections).  Talk with your health care provider about taking a low-dose aspirin or statin. What's next?  Go to your health care provider once a year for a well check visit.  Ask your health care provider how often you should have your eyes and teeth checked.  Stay up to date on all vaccines. This information is not intended to replace advice given to you by your health care provider. Make sure you discuss any questions you have with your health care provider. Document Revised: 03/29/2018 Document Reviewed: 03/29/2018 Elsevier Patient Education  2020 [employer].

## 2020-02-04 NOTE — Progress Notes (Signed)
Patient: Annette Coleman, Female    DOB: June 19, 1950, 69 y.o.   MRN: [id number] Delsa Grana, PA-C Visit Date: 02/04/2020  Today's Provider: Delsa Grana, PA-C   Chief Complaint  Patient presents with  . Annual Exam  . Hypothyroidism  . Gastroesophageal Reflux  . Hyperlipidemia   Subjective:   Annual physical exam:  Annette Coleman is a 69 y.o. female who presents today for complete physical exam:  Exercise/Activity:  Walks for job, no other exercise Diet/nutrition: not great diet, ice cream making her gain weight, eats veggies but only a few times a week Sleep: sleeps great  HLD - on 10 mg pravastatin, pt reports good med compliance Last Lipids: Lab Results  Component Value Date   CHOL 212 (H) 08/05/2019   HDL 59 08/05/2019   LDLCALC 131 (H) 08/05/2019   TRIG 113 08/05/2019   CHOLHDL 3.6 08/05/2019   - Denies: Chest pain, shortness of breath, myalgias, claudication  Rash to right side of body after flu shot Right LE swelling going up and down, raised itchy and dry rash not improved with any creams or rx, saw dermatology for it, gave her an ointment   She wants celiac testing to see if the rash is from that, no diarrhea or food sensitivities  Right hip and right knee pain, stiff, sore, difficult to move or get up from sitting or squatting down  Hypothyroidism: Current Medication Regimen: 75 tirosint Takes medicine morning on empty stomach Current Symptoms: denies fatigue, weight changes, heat/cold intolerance, bowel/skin changes or CVS symptoms Most recent results are below; we will be repeating labs today. Lab Results  Component Value Date   TSH 0.77 08/05/2019   Irregularity to MRI from MRI brain from Dr. Melrose Nakayama July 30/2021: Decreased T1 signal of the bone marrow visualized upper cervical spine, may represent red marrow reconversion. Correlate with CBC. Last CBC were normal Hemoglobin  Date Value Ref Range Status  04/08/2019 11.9 11.7 - 15.5 g/dL Final    01/07/2019 12.3 11.1 - 15.9 g/dL Final  01/01/2019 11.3 (L) 11.7 - 15.5 g/dL Final  12/04/2018 11.7 11.7 - 15.5 g/dL Final  05/23/2018 12.1 11.7 - 15.5 g/dL Final     She is worried about cancers in her family, brother has brain CA, sister had CA not sure what kind, dad had multiple CA's not sure what kind either  Anxiety/depression - pt states her sx are "good - as far as she knows" On wellbutrin 300 mg and lexapro 10 mg   USPSTF grade A and B recommendations - reviewed and addressed today  Depression:  Phq 9 completed today by patient, was reviewed by me with patient in the room PHQ score is neg, pt feels good PHQ 2/9 Scores 02/04/2020 10/02/2019 08/05/2019 04/05/2019  PHQ - 2 Score 1 0 0 0  PHQ- 9 Score - 0 0 6   Depression screen [facility] 2/9 02/04/2020 10/02/2019 08/05/2019 04/05/2019 03/22/2019  Decreased Interest 1 0 0 0 0  Down, Depressed, Hopeless 0 0 0 0 0  PHQ - 2 Score 1 0 0 0 0  Altered sleeping - 0 0 0 0  Tired, decreased energy - 0 0 3 0  Change in appetite - 0 0 0 0  Feeling bad or failure about yourself  - 0 0 0 0  Trouble concentrating - 0 0 3 0  Moving slowly or fidgety/restless - 0 0 0 0  Suicidal thoughts - 0 0 0 0  PHQ-9 Score - 0 0 6  0  Difficult doing work/chores - Not difficult at all Not difficult at all Somewhat difficult Not difficult at all    Alcohol screening:   Office Visit from 02/04/2020 in [facility]  AUDIT-C Score 0      Immunizations and Health Maintenance: Health Maintenance  Topic Date Due  . INFLUENZA VACCINE  07/16/2020 (Originally 11/17/2019)  . TETANUS/TDAP  08/04/2020 (Originally 03/30/1971)  . Hepatitis C Screening  08/04/2020 (Originally 1950-12-31)  . PNA vac Low Risk Adult (1 of 2 - PCV13) 08/04/2020 (Originally 03/29/2017)  . MAMMOGRAM  08/11/2020  . COLONOSCOPY  01/04/2024  . DEXA SCAN  Completed  . COVID-19 Vaccine  Discontinued    COVID vaccine did one shot about 2 months ago  Hep C Screening:  declined  STD testing and prevention (HIV/chl/gon/syphilis):  see above, no additional testing desired by pt today  Intimate partner violence:  safe Sexual History/Pain during Intercourse: Married  Menstrual History/LMP/Abnormal Bleeding:    No LMP recorded. Patient has had a hysterectomy.  Incontinence Symptoms: none  Breast cancer:  Last Mammogram: see HM list above BRCA gene screening: none  Cervical cancer screening: aged out Pt denies family hx of cancers - breast, ovarian, uterine, colon:     Osteoporosis:   Discussion on osteoporosis per age, including high calcium and vitamin D supplementation, weight bearing exercises Pt is supplementing with daily calcium/Vit D.   Skin cancer:  Hx of skin CA -  NO - sees dermatology Discussed atypical lesions   Colorectal cancer:   Colonoscopy is UTD Discussed concerning signs and sx of CRC, denies melena, hematochezia  Lung cancer:   Low Dose CT Chest recommended if Age 11-80 years, 30 pack-year currently smoking OR have quit w/in 15years. Patient does not qualify.    Social History   Tobacco Use  . Smoking status: Former Smoker    Quit date: 2012    Years since quitting: 9.8  . Smokeless tobacco: Never Used  Vaping Use  . Vaping Use: Never used  Substance Use Topics  . Alcohol use: Yes    Comment: rare  . Drug use: Not Currently       Office Visit from 02/04/2020 in [facility]  AUDIT-C Score 0      Family History  Problem Relation Age of Onset  . Stroke Mother   . Hypertension Mother   . Prostate cancer Father   . Cancer Sister   . Cancer Brother        brain  . Hypertension Sister      Blood pressure/Hypertension: BP Readings from Last 3 Encounters:  02/04/20 120/76  10/02/19 (!) 150/90  08/05/19 122/82    Weight/Obesity: Wt Readings from Last 3 Encounters:  02/04/20 180 lb 8 oz (81.9 kg)  10/02/19 175 lb 8 oz (79.6 kg)  08/05/19 173 lb 3.2 oz (78.6 kg)   BMI Readings from  Last 3 Encounters:  02/04/20 33.01 kg/m  10/02/19 32.10 kg/m  08/05/19 30.68 kg/m     Lipids:  Lab Results  Component Value Date   CHOL 212 (H) 08/05/2019   CHOL 137 01/01/2019   CHOL 158 12/04/2018   Lab Results  Component Value Date   HDL 59 08/05/2019   HDL 58 01/01/2019   HDL 67 12/04/2018   Lab Results  Component Value Date   LDLCALC 131 (H) 08/05/2019   LDLCALC 63 01/01/2019   LDLCALC 71 12/04/2018   Lab Results  Component Value Date   TRIG  113 08/05/2019   TRIG 84 01/01/2019   TRIG 115 12/04/2018   Lab Results  Component Value Date   CHOLHDL 3.6 08/05/2019   CHOLHDL 2.4 01/01/2019   CHOLHDL 2.4 12/04/2018   No results found for: LDLDIRECT Based on the results of lipid panel his/her cardiovascular risk factor ( using [facility] )  in the next 10 years is: The 10-year ASCVD risk score Mikey Bussing DC Brooke Bonito., et al., 2013) is: 6.1%   Values used to calculate the score:     Age: 53 years     Sex: Female     Is Non-Hispanic African American: No     Diabetic: No     Tobacco smoker: No     Systolic Blood Pressure: 774 mmHg     Is BP treated: No     HDL Cholesterol: 59 mg/dL     Total Cholesterol: 212 mg/dL Glucose:  Glucose  Date Value Ref Range Status  01/07/2019 78 65 - 99 mg/dL Final   Glucose, Bld  Date Value Ref Range Status  08/05/2019 84 65 - 139 mg/dL Final    Comment:    .        Non-fasting reference interval .   04/08/2019 94 65 - 99 mg/dL Final    Comment:    .            Fasting reference interval .   01/01/2019 96 65 - 99 mg/dL Final    Comment:    .            Fasting reference interval .    Hypertension: BP Readings from Last 3 Encounters:  02/04/20 120/76  10/02/19 (!) 150/90  08/05/19 122/82   Obesity: Wt Readings from Last 3 Encounters:  02/04/20 180 lb 8 oz (81.9 kg)  10/02/19 175 lb 8 oz (79.6 kg)  08/05/19 173 lb 3.2 oz (78.6 kg)   BMI Readings from Last 3 Encounters:  02/04/20 33.01 kg/m  10/02/19 32.10 kg/m   08/05/19 30.68 kg/m    Social History      She        Social History   Socioeconomic History  . Marital status: Married    Spouse name: Shirlean Mylar  . Number of children: 2  . Years of education: 18  . Highest education level: Associate degree: occupational, Hotel manager, or vocational program  Occupational History  . Not on file  Tobacco Use  . Smoking status: Former Smoker    Quit date: 2012    Years since quitting: 9.8  . Smokeless tobacco: Never Used  Vaping Use  . Vaping Use: Never used  Substance and Sexual Activity  . Alcohol use: Yes    Comment: rare  . Drug use: Not Currently  . Sexual activity: Not Currently  Other Topics Concern  . Not on file  Social History Narrative  . Not on file   Social Determinants of Health   Financial Resource Strain: Medium Risk  . Difficulty of Paying Living Expenses: Somewhat hard  Food Insecurity: No Food Insecurity  . Worried About Charity fundraiser in the Last Year: Never true  . Ran Out of Food in the Last Year: Never true  Transportation Needs: No Transportation Needs  . Lack of Transportation (Medical): No  . Lack of Transportation (Non-Medical): No  Physical Activity: Inactive  . Days of Exercise per Week: 0 days  . Minutes of Exercise per Session: 0 min  Stress: No Stress Concern Present  . Feeling  of Stress : Not at all  Social Connections: Moderately Isolated  . Frequency of Communication with Friends and Family: Once a week  . Frequency of Social Gatherings with Friends and Family: Once a week  . Attends Religious Services: 1 to 4 times per year  . Active Member of Clubs or Organizations: No  . Attends Archivist Meetings: Never  . Marital Status: Married    Family History        Family History  Problem Relation Age of Onset  . Stroke Mother   . Hypertension Mother   . Prostate cancer Father   . Cancer Sister   . Cancer Brother        brain  . Hypertension Sister     Patient Active Problem  List   Diagnosis Date Noted  . History of colonic polyps   . Polyp of colon   . Gastroesophageal reflux disease 12/04/2018  . Mild episode of recurrent major depressive disorder ([place]) 12/04/2018  . Obesity (BMI 30.0-34.9) 05/23/2018  . Mixed hyperlipidemia 03/05/2018  . Current long-term use of postmenopausal hormone replacement therapy 06/10/2017  . Memory difficulties 05/04/2017  . Vitamin D deficiency 02/08/2017  . Hypothyroidism 11/08/2016    Past Surgical History:  Procedure Laterality Date  . ABDOMINAL HYSTERECTOMY    . APPENDECTOMY    . BUNIONECTOMY    . CHOLECYSTECTOMY    . COLONOSCOPY WITH PROPOFOL N/A 01/04/2019   Procedure: COLONOSCOPY WITH PROPOFOL;  Surgeon: Virgel Manifold, MD;  Location: [facility] ENDOSCOPY;  Service: Endoscopy;  Laterality: N/A;  . complete parotidectomy    . ESOPHAGOGASTRODUODENOSCOPY    . jaw arthroscopy    . TUBAL LIGATION       Current Outpatient Medications:  .  Apple Cider Vinegar 188 MG CAPS, Take by mouth., Disp: , Rfl:  .  buPROPion (WELLBUTRIN XL) 300 MG 24 hr tablet, TAKE 1 TABLET BY MOUTH EVERY DAY, Disp: 30 tablet, Rfl: 5 .  cholecalciferol (VITAMIN D3) 25 MCG (1000 UT) tablet, Take 1,000 Units by mouth daily., Disp: , Rfl:  .  ELDERBERRY PO, Take 1 tablet by mouth daily., Disp: , Rfl:  .  escitalopram (LEXAPRO) 10 MG tablet, Take 1 tablet (10 mg total) by mouth daily., Disp: 90 tablet, Rfl: 3 .  nortriptyline (PAMELOR) 10 MG capsule, Take 20 mg by mouth at bedtime., Disp: , Rfl:  .  pantoprazole (PROTONIX) 20 MG tablet, TAKE 1 TABLET BY MOUTH EVERY DAY, Disp: 90 tablet, Rfl: 3 .  pravastatin (PRAVACHOL) 10 MG tablet, Take 1 tablet (10 mg total) by mouth daily., Disp: 30 tablet, Rfl: 11 .  TIROSINT 75 MCG CAPS, Take 1 capsule (75 mcg total) by mouth daily before breakfast., Disp: 30 capsule, Rfl: 11 .  clobetasol cream (TEMOVATE) 7.49 %, Apply 1 application topically 2 (two) times daily. Avoid applying to face, groin, and axilla. Use  as directed. Risk of skin atrophy with long-term use reviewed. (Patient not taking: Reported on 02/04/2020), Disp: 60 g, Rfl: 1 .  cyanocobalamin 1000 MCG tablet, Take 1,000 mcg by mouth daily. (Patient not taking: Reported on 02/04/2020), Disp: , Rfl:  .  triamcinolone cream (KENALOG) 0.1 %, apply to affected areas once to twice dialy on Lower leg.  Avoid applying to face, groin, and axilla. (Patient not taking: Reported on 02/04/2020), Disp: 45 g, Rfl: 2  Not on File  Patient Care Team: Delsa Grana, PA-C as PCP - General (Family Medicine)  Review of Systems  10 Systems reviewed and  are negative for acute change except as noted in the HPI.   I personally reviewed active problem list, medication list, allergies, family history, social history, health maintenance, notes from last encounter, lab results, imaging with the patient/caregiver today.        Objective:   Vitals:  Vitals:   02/04/20 0951  BP: 120/76  Pulse: 77  Resp: 16  Temp: 98.4 F (36.9 C)  TempSrc: Oral  SpO2: 99%  Weight: 180 lb 8 oz (81.9 kg)  Height: _0  (1.575 m)    Body mass index is 33.01 kg/m.  Physical Exam Vitals and nursing note reviewed.  Constitutional:      General: She is not in acute distress.    Appearance: Normal appearance. She is well-developed. She is obese. She is not ill-appearing, toxic-appearing or diaphoretic.     Interventions: Face mask in place.  HENT:     Head: Normocephalic and atraumatic.     Right Ear: External ear normal.     Left Ear: External ear normal.  Eyes:     General: Lids are normal. No scleral icterus.       Right eye: No discharge.        Left eye: No discharge.     Conjunctiva/sclera: Conjunctivae normal.  Neck:     Trachea: Phonation normal. No tracheal deviation.  Cardiovascular:     Rate and Rhythm: Normal rate and regular rhythm.     Pulses: Normal pulses.          Radial pulses are 2+ on the right side and 2+ on the left side.       Posterior  tibial pulses are 2+ on the right side and 2+ on the left side.     Heart sounds: Normal heart sounds. No murmur heard.  No friction rub. No gallop.   Pulmonary:     Effort: Pulmonary effort is normal. No respiratory distress.     Breath sounds: Normal breath sounds. No stridor. No wheezing, rhonchi or rales.  Chest:     Chest wall: No tenderness.  Abdominal:     General: Bowel sounds are normal. There is no distension.     Palpations: Abdomen is soft.     Tenderness: There is no abdominal tenderness. There is no right CVA tenderness, left CVA tenderness, guarding or rebound.  Musculoskeletal:     Right lower leg: No edema.     Left lower leg: No edema.     Comments: Right hip and right knee mildly decreased ROM, no deformity, bony tenderness, erythema  Skin:    General: Skin is warm and dry.     Coloration: Skin is not jaundiced or pale.     Findings: Rash present.  Neurological:     Mental Status: She is alert.     Motor: No abnormal muscle tone.     Gait: Gait abnormal.  Psychiatric:        Mood and Affect: Mood normal.        Speech: Speech normal.        Behavior: Behavior normal.       Fall Risk: Fall Risk  02/04/2020 10/02/2019 08/05/2019 04/05/2019 03/22/2019  Falls in the past year? 0 0 0 0 0  Number falls in past yr: 0 0 0 0 0  Injury with Fall? 0 0 0 0 0  Follow up Falls evaluation completed - - - -    Functional Status Survey: Is the patient deaf or have difficulty hearing?:  No Does the patient have difficulty seeing, even when wearing glasses/contacts?: No Does the patient have difficulty concentrating, remembering, or making decisions?: Yes Does the patient have difficulty walking or climbing stairs?: Yes Does the patient have difficulty dressing or bathing?: No Does the patient have difficulty doing errands alone such as visiting a doctor's office or shopping?: No   Assessment & Plan:    CPE completed today  . USPSTF grade A and B recommendations  reviewed with patient; age-appropriate recommendations, preventive care, screening tests, etc discussed and encouraged; healthy living encouraged; see AVS for patient education given to patient  . Discussed importance of 150 minutes of physical activity weekly, AHA exercise recommendations given to pt in AVS/handout  . Discussed importance of healthy diet:  eating lean meats and proteins, avoiding trans fats and saturated fats, avoid simple sugars and excessive carbs in diet, eat 6 servings of fruit/vegetables daily and drink plenty of water and avoid sweet beverages.    . Recommended pt to do annual eye exam and routine dental exams/cleanings  . Depression, alcohol, fall screening completed as documented above and per flowsheets  . Reviewed Health Maintenance: Health Maintenance  Topic Date Due  . INFLUENZA VACCINE  07/16/2020 (Originally 11/17/2019)  . TETANUS/TDAP  08/04/2020 (Originally 03/30/1971)  . Hepatitis C Screening  08/04/2020 (Originally 12-13-1950)  . PNA vac Low Risk Adult (1 of 2 - PCV13) 08/04/2020 (Originally 03/29/2017)  . MAMMOGRAM  08/11/2020  . COLONOSCOPY  01/04/2024  . DEXA SCAN  Completed  . COVID-19 Vaccine  Discontinued     ICD-10-CM   1. Adult general medical exam  Z00.00 CBC with Differential/Platelet    COMPLETE METABOLIC PANEL WITH GFR    Lipid panel    Hemoglobin A1c    TSH  2. Mild episode of recurrent major depressive disorder (HCC)  F33.0    PHQ reviewed neg, mood seems anxious, pt states she is doing well on wellbutrin and lexapro  3. Hypothyroidism, unspecified type  E03.9 TSH   pt denies any concern about dosing, recheck labs  4. Mixed hyperlipidemia  A21.3 COMPLETE METABOLIC PANEL WITH GFR    Lipid panel   LDL not at goal, discussed diet efforts and possibly adding zetia  5. Obesity (BMI 30.0-34.9)  E66.9   6. Medication monitoring encounter  Z51.81 CBC with Differential/Platelet    COMPLETE METABOLIC PANEL WITH GFR    Lipid panel    TSH    7. Encounter for screening mammogram for malignant neoplasm of breast  Z12.31 MM 3D SCREEN BREAST BILATERAL  8. Rash and nonspecific skin eruption  R21    advised to tx with vaseline and topical steroids, f/up with dermatology if not improved  9. Gastroesophageal reflux disease, unspecified whether esophagitis present  K21.9    recent flare, encouraged her to double protonix dose if sx worsening or can try pepcid BID PRN, monitor diet/lifestyle for possible triggers  10. Dysuria  R30.0 POCT urinalysis dipstick   pt suspects UTI last week, managed with OTC juices and supplements  11. Right hip pain  M25.551 Ambulatory referral to Orthopedic Surgery   antalgic gait, no injury, pt notes worse ambulation, not seen or evaluated before, would like ortho eval for right hip and right knee  12. Acute pain of right knee  M25.561 Ambulatory referral to Orthopedic Surgery   same as right hip - no injury, pain and difficulty with mobility and stiffness       Delsa Grana, PA-C 02/04/20 10:33 AM  [facility]

## 2020-02-05 LAB — COMPLETE METABOLIC PANEL WITH GFR
AG Ratio: 1.6 (calc) (ref 1.0–2.5)
ALT: 16 U/L (ref 6–29)
AST: 17 U/L (ref 10–35)
Albumin: 4.2 g/dL (ref 3.6–5.1)
Alkaline phosphatase (APISO): 55 U/L (ref 37–153)
BUN: 13 mg/dL (ref 7–25)
CO2: 29 mmol/L (ref 20–32)
Calcium: 9.4 mg/dL (ref 8.6–10.4)
Chloride: 102 mmol/L (ref 98–110)
Creat: 0.86 mg/dL (ref 0.50–0.99)
GFR, Est African American: 81 mL/min/{1.73_m2} (ref >=60)
GFR, Est Non African American: 70 mL/min/{1.73_m2} (ref >=60)
Globulin: 2.6 g/dL (calc) (ref 1.9–3.7)
Glucose, Bld: 99 mg/dL (ref 65–99)
Potassium: 4.6 mmol/L (ref 3.5–5.3)
Sodium: 137 mmol/L (ref 135–146)
Total Bilirubin: 0.4 mg/dL (ref 0.2–1.2)
Total Protein: 6.8 g/dL (ref 6.1–8.1)

## 2020-02-05 LAB — CBC WITH DIFFERENTIAL/PLATELET
Absolute Monocytes: 540 cells/uL (ref 200–950)
Basophils Absolute: 72 cells/uL (ref 0–200)
Basophils Relative: 1.2 %
Eosinophils Absolute: 432 cells/uL (ref 15–500)
Eosinophils Relative: 7.2 %
HCT: 33.6 % — ABNORMAL LOW (ref 35.0–45.0)
Hemoglobin: 11.4 g/dL — ABNORMAL LOW (ref 11.7–15.5)
Lymphs Abs: 1560 cells/uL (ref 850–3900)
MCH: 32.3 pg (ref 27.0–33.0)
MCHC: 33.9 g/dL (ref 32.0–36.0)
MCV: 95.2 fL (ref 80.0–100.0)
MPV: 9.7 fL (ref 7.5–12.5)
Monocytes Relative: 9 %
Neutro Abs: 3396 cells/uL (ref 1500–7800)
Neutrophils Relative %: 56.6 %
Platelets: 309 10*3/uL (ref 140–400)
RBC: 3.53 10*6/uL — ABNORMAL LOW (ref 3.80–5.10)
RDW: 12 % (ref 11.0–15.0)
Total Lymphocyte: 26 %
WBC: 6 10*3/uL (ref 3.8–10.8)

## 2020-02-05 LAB — LIPID PANEL
Cholesterol: 168 mg/dL (ref <200)
HDL: 62 mg/dL (ref >=50)
LDL Cholesterol (Calc): 86 mg/dL (calc)
Non-HDL Cholesterol (Calc): 106 mg/dL (calc) (ref <130)
Total CHOL/HDL Ratio: 2.7 (calc) (ref <5.0)
Triglycerides: 107 mg/dL (ref <150)

## 2020-02-05 LAB — HEMOGLOBIN A1C
Hgb A1c MFr Bld: 5.6 % of total Hgb (ref <5.7)
Mean Plasma Glucose: 114 (calc)
eAG (mmol/L): 6.3 (calc)

## 2020-02-05 LAB — TSH: TSH: 0.41 mIU/L (ref 0.40–4.50)

## 2020-02-14 ENCOUNTER — Telehealth: Payer: Self-pay

## 2020-02-14 NOTE — Telephone Encounter (Signed)
Please advise 

## 2020-02-14 NOTE — Telephone Encounter (Signed)
Copied from [place] [phone]. Topic: Referral - Request for Referral >> Feb 14, 2020  1:20 PM Lennox Solders wrote: Has patient seen PCP for this complaint? Yes. Pt would like a referral to endocrinologist dr Tawni Pummel phone [phone]. For her thyroid pt would like deeper studies   Pt has [employer]

## 2020-02-16 ENCOUNTER — Other Ambulatory Visit: Payer: Self-pay | Admitting: Family Medicine

## 2020-02-16 DIAGNOSIS — F330 Major depressive disorder, recurrent, mild: Secondary | ICD-10-CM

## 2020-02-17 ENCOUNTER — Other Ambulatory Visit: Payer: Self-pay | Admitting: Emergency Medicine

## 2020-02-17 DIAGNOSIS — E039 Hypothyroidism, unspecified: Secondary | ICD-10-CM

## 2020-02-17 NOTE — Telephone Encounter (Signed)
Order placed

## 2020-02-18 NOTE — Telephone Encounter (Addendum)
Pt following up on referral. Pt states please remember to send to  Dr [facility] [phone]  Looks like referral order placed but not sent anywhere yet.

## 2020-02-20 NOTE — Telephone Encounter (Signed)
There are no referrals to endocrinology in her chart.

## 2020-02-20 NOTE — Telephone Encounter (Signed)
Please check on this referral  

## 2020-02-20 NOTE — Telephone Encounter (Signed)
Annette Coleman, Please see comments - I do not see referral placed either, you previously said you put in the order?  Can you please put in the referral to endocrinology, [facility] per pt preference for Dx of hypothyroid thanks

## 2020-02-21 ENCOUNTER — Other Ambulatory Visit: Payer: Self-pay | Admitting: Emergency Medicine

## 2020-02-21 DIAGNOSIS — E039 Hypothyroidism, unspecified: Secondary | ICD-10-CM

## 2020-02-24 NOTE — Telephone Encounter (Signed)
Referral placed again.

## 2020-02-26 DIAGNOSIS — I10 Essential (primary) hypertension: Secondary | ICD-10-CM | POA: Insufficient documentation

## 2020-03-08 ENCOUNTER — Emergency Department
Admission: EM | Admit: 2020-03-08 | Discharge: 2020-03-08 | Disposition: A | Discharge status: Home / Self Care | Payer: Managed Care, Other (non HMO) | Attending: Emergency Medicine | Admitting: Emergency Medicine

## 2020-03-08 ENCOUNTER — Emergency Department: Payer: Managed Care, Other (non HMO)

## 2020-03-08 ENCOUNTER — Other Ambulatory Visit: Payer: Self-pay

## 2020-03-08 DIAGNOSIS — S0990XA Unspecified injury of head, initial encounter: Secondary | ICD-10-CM | POA: Diagnosis present

## 2020-03-08 DIAGNOSIS — R42 Dizziness and giddiness: Secondary | ICD-10-CM | POA: Diagnosis not present

## 2020-03-08 DIAGNOSIS — W1839XA Other fall on same level, initial encounter: Secondary | ICD-10-CM | POA: Diagnosis not present

## 2020-03-08 DIAGNOSIS — E039 Hypothyroidism, unspecified: Secondary | ICD-10-CM | POA: Insufficient documentation

## 2020-03-08 DIAGNOSIS — Z87891 Personal history of nicotine dependence: Secondary | ICD-10-CM | POA: Insufficient documentation

## 2020-03-08 DIAGNOSIS — S5001XA Contusion of right elbow, initial encounter: Secondary | ICD-10-CM | POA: Diagnosis not present

## 2020-03-08 DIAGNOSIS — Y93K1 Activity, walking an animal: Secondary | ICD-10-CM | POA: Insufficient documentation

## 2020-03-08 DIAGNOSIS — S5001XD Contusion of right elbow, subsequent encounter: Secondary | ICD-10-CM

## 2020-03-08 DIAGNOSIS — F0781 Postconcussional syndrome: Secondary | ICD-10-CM | POA: Insufficient documentation

## 2020-03-08 NOTE — ED Provider Notes (Signed)
[facility] Emergency Department Provider Note ____________________________________________  Time seen: 1701  I have reviewed the triage vital signs and the nursing notes.  HISTORY  Chief Complaint  Fall  HPI Annette Coleman is a 69 y.o. female presents to the ED for evaluation of ongoing right elbow pain and intermittent dizziness.  Patient describes a mechanical fall that occurred on Thursday.  She was walking her dog, and recalls having the dog's leash wrapped around her hand and wrist.  The dog pulled and she pulled restrain the dog, but ended up falling landing on her right side.  She believes she may have hit her head because she has little recall of details following the fall.  She denies any intermittent headache, nausea, vomiting, or dizziness.  She does report some mild intermittent lightheadedness.  She was evaluated following the incident at [facility], her x-ray of the elbow was negative at the time, patient was advised to report to the ED for further evaluation of her head injury.  She declined at that time, and presents now for evaluation of her symptoms.  She denies any blood thinner use.  She has been using meloxicam for her bursitis.   Past Medical History:  Diagnosis Date  . ADHD   . Anemia   . Anemia 12/04/2017  . 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 Depression   . Elevated blood pressure reading without diagnosis of hypertension   . Hormone replacement therapy   . Memory impairment   . Thyroid disease   . Venous insufficiency of both lower extremities 11/13/2016    Patient Active Problem List   Diagnosis Date Noted  . History of colonic polyps   . Polyp of colon   . Gastroesophageal reflux disease 12/04/2018  . Mild episode of recurrent major depressive disorder ([place]) 12/04/2018  . Obesity (BMI 30.0-34.9) 05/23/2018  . Mixed hyperlipidemia 03/05/2018  . Current long-term  use of postmenopausal hormone replacement therapy 06/10/2017  . Memory difficulties 05/04/2017  . Vitamin D deficiency 02/08/2017  . Hypothyroidism 11/08/2016    Past Surgical History:  Procedure Laterality Date  . ABDOMINAL HYSTERECTOMY    . APPENDECTOMY    . BUNIONECTOMY    . CHOLECYSTECTOMY    . COLONOSCOPY WITH PROPOFOL N/A 01/04/2019   Procedure: COLONOSCOPY WITH PROPOFOL;  Surgeon: Virgel Manifold, MD;  Location: [facility] ENDOSCOPY;  Service: Endoscopy;  Laterality: N/A;  . complete parotidectomy    . ESOPHAGOGASTRODUODENOSCOPY    . jaw arthroscopy    . TUBAL LIGATION      Prior to Admission medications   Medication Sig Start Date End Date Taking? Authorizing Provider  Apple Cider Vinegar 188 MG CAPS Take by mouth.    [provider]  buPROPion (WELLBUTRIN XL) 300 MG 24 hr tablet TAKE 1 TABLET BY MOUTH EVERY DAY 01/21/20   Delsa Grana, PA-C  cholecalciferol (VITAMIN D3) 25 MCG (1000 UT) tablet Take 1,000 Units by mouth daily.    [provider]  clobetasol cream (TEMOVATE) 0.35 % Apply 1 application topically 2 (two) times daily. Avoid applying to face, groin, and axilla. Use as directed. Risk of skin atrophy with long-term use reviewed. Patient not taking: Reported on 02/04/2020 12/17/19   Brendolyn Patty, MD  cyanocobalamin 1000 MCG tablet Take 1,000 mcg by mouth daily. Patient not taking: Reported on 02/04/2020    [provider]  ELDERBERRY PO Take 1 tablet by mouth daily.    [provider]  escitalopram (LEXAPRO) 10 MG tablet TAKE 1 TABLET BY MOUTH EVERY DAY 02/17/20   Delsa Grana, PA-C  nortriptyline (PAMELOR) 10 MG capsule Take 20 mg by mouth at bedtime.    [provider]  pantoprazole (PROTONIX) 20 MG tablet TAKE 1 TABLET BY MOUTH EVERY DAY 08/14/19   Delsa Grana, PA-C  pravastatin (PRAVACHOL) 10 MG tablet Take 1 tablet (10 mg total) by mouth daily. 08/09/19   Delsa Grana, PA-C  TIROSINT 75 MCG CAPS Take 1 capsule (75 mcg  total) by mouth daily before breakfast. 10/11/19   Delsa Grana, PA-C  triamcinolone cream (KENALOG) 0.1 % apply to affected areas once to twice dialy on Lower leg.  Avoid applying to face, groin, and axilla. Patient not taking: Reported on 02/04/2020 12/10/19   Brendolyn Patty, MD  TIROSINT 88 MCG CAPS TAKE 1 CAPSULE (88 MCG TOTAL) BY MOUTH DAILY. 02/21/19   Delsa Grana, PA-C    Allergies Patient has no allergy information on record.  Family History  Problem Relation Age of Onset  . Stroke Mother   . Hypertension Mother   . Prostate cancer Father   . Cancer Sister   . Cancer Brother        brain  . Hypertension Sister     Social History Social History   Tobacco Use  . Smoking status: Former Smoker    Quit date: 2012    Years since quitting: 9.8  . Smokeless tobacco: Never Used  Vaping Use  . Vaping Use: Never used  Substance Use Topics  . Alcohol use: Yes    Comment: rare  . Drug use: Not Currently    Review of Systems  Constitutional: Negative for fever. Eyes: Negative for visual changes. ENT: Negative for sore throat. Cardiovascular: Negative for chest pain. Respiratory: Negative for shortness of breath. Gastrointestinal: Negative for abdominal pain, vomiting and diarrhea. Genitourinary: Negative for dysuria. Musculoskeletal: Negative for back pain. Right elbow pain  Skin: Negative for rash. Neurological: Negative for headaches, focal weakness or numbness. Reports lightheadness ____________________________________________  PHYSICAL EXAM:  VITAL SIGNS: ED Triage Vitals  Enc Vitals Group     BP 03/08/20 1519 (!) 167/76     Pulse Rate 03/08/20 1519 83     Resp 03/08/20 1519 18     Temp 03/08/20 1519 98 F (36.7 C)     Temp src --      SpO2 03/08/20 1519 98 %     Weight 03/08/20 1517 145 lb (65.8 kg)     Height 03/08/20 1517 5\' 4"  (1.626 m)     Head Circumference --      Peak Flow --      Pain Score 03/08/20 1516 8     Pain Loc --      Pain Edu? --       Excl. in [place]? --     Constitutional: Alert and oriented. Well appearing and in no distress. Head: Normocephalic and atraumatic. Eyes: Conjunctivae are normal. Normal extraocular movements Neck: Supple. Normal ROM. No midline tenderness, spasm, deformity or step-off Hematological/Lymphatic/Immunological: No cervical lymphadenopathy. Cardiovascular: Normal rate, regular rhythm. Normal distal pulses. Respiratory: Normal respiratory effort. No wheezes/rales/rhonchi. Gastrointestinal: Soft and nontender. No distention. Musculoskeletal: Right elbow without obvious deformity or dislocation.  Patient is noted to have some soft tissue swelling and ecchymosis extending from the olecranon process down the distal forearm.  She is able demonstrate normal forearm pronation and supination.  Normal extension and flexion of the elbow is demonstrated.  Normal composite fist  bilaterally.  Nontender with normal range of motion in all extremities.  Neurologic: CN II-XII grossly intact. Normal gait without ataxia. Normal speech and language. No gross focal neurologic deficits are appreciated. Skin:  Skin is warm, dry and intact. No rash noted. Psychiatric: Mood and affect are normal. Patient exhibits appropriate insight and judgment. ____________________________________________   RADIOLOGY  CT Head w/o CM  Negative  DG Right Elbow  Negative ____________________________________________  PROCEDURES  Procedures ____________________________________________  INITIAL IMPRESSION / ASSESSMENT AND PLAN / ED COURSE  Patient with ED evaluation of injury sustained following mechanical fall 3 days prior.  Patient presents with ongoing elbow pain and swelling, as well as some intermittent lightheadedness.  Exam is overall benign reassuring at this time.  No signs of acute neuromuscular deficit, and no signs of acute arthropathy to the right elbow.  CT imaging of the head is reassuring and elbow x-rays negative at this  time.  Patient is encouraged to follow-up with primary provider or Ortho provider for ongoing symptoms.  She is given close head injury precautions and symptomatic relief information related to her mild postconcussive syndrome.  Sibbie Flammia was evaluated in Emergency Department on 03/08/2020 for the symptoms described in the history of present illness. She was evaluated in the context of the global COVID-19 pandemic, which necessitated consideration that the patient might be at risk for infection with the SARS-CoV-2 virus that causes COVID-19. Institutional protocols and algorithms that pertain to the evaluation of patients at risk for COVID-19 are in a state of rapid change based on information released by regulatory bodies including the CDC and federal and state organizations. These policies and algorithms were followed during the patient's care in the ED. ___________________________________________  FINAL CLINICAL IMPRESSION(S) / ED DIAGNOSES  Final diagnoses:  Minor head injury, initial encounter  Post concussion syndrome  Contusion of right elbow, subsequent encounter      Melvenia Needles, PA-C 03/08/20 1821    Nance Pear, MD 03/08/20 1827

## 2020-03-08 NOTE — Discharge Instructions (Signed)
Your exam, CT, and XRs are all normal/negative following your injury last week. Follow-up with your primary care or ortho provider for continued symptoms.

## 2020-03-08 NOTE — ED Triage Notes (Signed)
First RN note: Pt states fell Thursday night, seen at [facility] and had elbow evaluated, pt states still having light-headedness and feels like like she has pins and needles to R elbow when laying down at night.

## 2020-03-08 NOTE — ED Triage Notes (Addendum)
Pt comes via POV from home with c/o mechanical fall earlier this week. Pt states she tripped and fell. Pt states pain to right elbow and arm.  Pt states pain is severe at night.  Pt also states pain to right hip and down right leg.

## 2020-07-09 ENCOUNTER — Emergency Department
Admission: EM | Admit: 2020-07-09 | Discharge: 2020-07-09 | Disposition: A | Discharge status: Home / Self Care | Payer: Managed Care, Other (non HMO) | Attending: Emergency Medicine | Admitting: Emergency Medicine

## 2020-07-09 ENCOUNTER — Other Ambulatory Visit: Payer: Self-pay

## 2020-07-09 DIAGNOSIS — N3000 Acute cystitis without hematuria: Secondary | ICD-10-CM | POA: Insufficient documentation

## 2020-07-09 DIAGNOSIS — Z87891 Personal history of nicotine dependence: Secondary | ICD-10-CM | POA: Diagnosis not present

## 2020-07-09 DIAGNOSIS — R300 Dysuria: Secondary | ICD-10-CM | POA: Diagnosis present

## 2020-07-09 DIAGNOSIS — E039 Hypothyroidism, unspecified: Secondary | ICD-10-CM | POA: Diagnosis not present

## 2020-07-09 DIAGNOSIS — N390 Urinary tract infection, site not specified: Secondary | ICD-10-CM

## 2020-07-09 LAB — URINALYSIS, COMPLETE (UACMP) WITH MICROSCOPIC
Bacteria, UA: NONE SEEN
RBC / HPF: >50 RBC/hpf — ABNORMAL HIGH (ref 0–5)
Specific Gravity, Urine: 1.03 (ref 1.005–1.030)
Squamous Epithelial / HPF: NONE SEEN (ref 0–5)
WBC, UA: >50 WBC/hpf — ABNORMAL HIGH (ref 0–5)

## 2020-07-09 LAB — BASIC METABOLIC PANEL
Anion gap: 9 (ref 5–15)
BUN: 18 mg/dL (ref 8–23)
CO2: 21 mmol/L — ABNORMAL LOW (ref 22–32)
Calcium: 9.3 mg/dL (ref 8.9–10.3)
Chloride: 104 mmol/L (ref 98–111)
Creatinine, Ser: 0.9 mg/dL (ref 0.44–1.00)
GFR, Estimated: >60 mL/min (ref >60)
Glucose, Bld: 111 mg/dL — ABNORMAL HIGH (ref 70–99)
Potassium: 4.4 mmol/L (ref 3.5–5.1)
Sodium: 134 mmol/L — ABNORMAL LOW (ref 135–145)

## 2020-07-09 LAB — CBC
HCT: 36 % (ref 36.0–46.0)
Hemoglobin: 12.1 g/dL (ref 12.0–15.0)
MCH: 31.2 pg (ref 26.0–34.0)
MCHC: 33.6 g/dL (ref 30.0–36.0)
MCV: 92.8 fL (ref 80.0–100.0)
Platelets: 450 10*3/uL — ABNORMAL HIGH (ref 150–400)
RBC: 3.88 MIL/uL (ref 3.87–5.11)
RDW: 12.2 % (ref 11.5–15.5)
WBC: 14.1 10*3/uL — ABNORMAL HIGH (ref 4.0–10.5)
nRBC: 0 % (ref 0.0–0.2)

## 2020-07-09 MED ORDER — CEPHALEXIN 500 MG PO CAPS: 500.0000 mg | ORAL_CAPSULE | Freq: Two times a day (BID) | ORAL | 0 refills | Status: AC

## 2020-07-09 MED ORDER — CEPHALEXIN 500 MG PO CAPS
500.0000 mg | ORAL_CAPSULE | Freq: Once | ORAL | Status: AC
  Administered 2020-07-09: 500 mg via ORAL
  Filled 2020-07-09: qty 1

## 2020-07-09 NOTE — ED Triage Notes (Signed)
Pt states coming in for burning and pain with urination and pain to the lower abdomen that just started. Pt states "I am pretty sure I have a bladder infection." Pt states using AZO  Prior to arrival. Pt states she is also taking prednisone.

## 2020-07-09 NOTE — ED Provider Notes (Signed)
[facility] Emergency Department Provider Note   ____________________________________________   Event Date/Time   First MD Initiated Contact with Patient 07/09/20 2224     (approximate)  I have reviewed the triage vital signs and the nursing notes.   HISTORY  Chief Complaint Dysuria    HPI Annette Coleman is a 70 y.o. female with past medical history of hyperlipidemia, hypothyroidism, and GERD who presents to the ED complaining of dysuria.  Patient reports that she has had about 12 hours of worsening pressure in her suprapubic area associated with dysuria and hematuria.  She denies any fevers, nausea, vomiting, or flank pain.  She describes current symptoms as similar to prior urinary tract infections.  She tried AZO prior to arrival without significant relief.        Past Medical History:  Diagnosis Date  . ADHD   . Anemia   . Anemia 12/04/2017  . 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 Depression   . Elevated blood pressure reading without diagnosis of hypertension   . Hormone replacement therapy   . Memory impairment   . Thyroid disease   . Venous insufficiency of both lower extremities 11/13/2016    Patient Active Problem List   Diagnosis Date Noted  . History of colonic polyps   . Polyp of colon   . Gastroesophageal reflux disease 12/04/2018  . Mild episode of recurrent major depressive disorder ([place]) 12/04/2018  . Obesity (BMI 30.0-34.9) 05/23/2018  . Mixed hyperlipidemia 03/05/2018  . Current long-term use of postmenopausal hormone replacement therapy 06/10/2017  . Memory difficulties 05/04/2017  . Vitamin D deficiency 02/08/2017  . Hypothyroidism 11/08/2016    Past Surgical History:  Procedure Laterality Date  . ABDOMINAL HYSTERECTOMY    . APPENDECTOMY    . BUNIONECTOMY    . CHOLECYSTECTOMY    . COLONOSCOPY WITH PROPOFOL N/A 01/04/2019   Procedure: COLONOSCOPY WITH PROPOFOL;   Surgeon: Virgel Manifold, MD;  Location: [facility] ENDOSCOPY;  Service: Endoscopy;  Laterality: N/A;  . complete parotidectomy    . ESOPHAGOGASTRODUODENOSCOPY    . jaw arthroscopy    . TUBAL LIGATION      Prior to Admission medications   Medication Sig Start Date End Date Taking? Authorizing Provider  cephALEXin (KEFLEX) 500 MG capsule Take 1 capsule (500 mg total) by mouth 2 (two) times daily for 7 days. 07/09/20 07/16/20 Yes Blake Divine, MD  Apple Cider Vinegar 188 MG CAPS Take by mouth.    [provider]  buPROPion (WELLBUTRIN XL) 300 MG 24 hr tablet TAKE 1 TABLET BY MOUTH EVERY DAY 01/21/20   Delsa Grana, PA-C  cholecalciferol (VITAMIN D3) 25 MCG (1000 UT) tablet Take 1,000 Units by mouth daily.    [provider]  clobetasol cream (TEMOVATE) 7.25 % Apply 1 application topically 2 (two) times daily. Avoid applying to face, groin, and axilla. Use as directed. Risk of skin atrophy with long-term use reviewed. Patient not taking: Reported on 02/04/2020 12/17/19   Brendolyn Patty, MD  cyanocobalamin 1000 MCG tablet Take 1,000 mcg by mouth daily. Patient not taking: Reported on 02/04/2020    [provider]  ELDERBERRY PO Take 1 tablet by mouth daily.    [provider]  escitalopram (LEXAPRO) 10 MG tablet TAKE 1 TABLET BY MOUTH EVERY DAY 02/17/20   Delsa Grana, PA-C  nortriptyline (PAMELOR) 10 MG capsule Take 20 mg by mouth at bedtime.    [provider]  pantoprazole (PROTONIX) 20 MG tablet  TAKE 1 TABLET BY MOUTH EVERY DAY 08/14/19   Delsa Grana, PA-C  pravastatin (PRAVACHOL) 10 MG tablet Take 1 tablet (10 mg total) by mouth daily. 08/09/19   Delsa Grana, PA-C  TIROSINT 75 MCG CAPS Take 1 capsule (75 mcg total) by mouth daily before breakfast. 10/11/19   Delsa Grana, PA-C  triamcinolone cream (KENALOG) 0.1 % apply to affected areas once to twice dialy on Lower leg.  Avoid applying to face, groin, and axilla. Patient not taking: Reported on  02/04/2020 12/10/19   Brendolyn Patty, MD    Allergies Patient has no known allergies.  Family History  Problem Relation Age of Onset  . Stroke Mother   . Hypertension Mother   . Prostate cancer Father   . Cancer Sister   . Cancer Brother        brain  . Hypertension Sister     Social History Social History   Tobacco Use  . Smoking status: Former Smoker    Quit date: 2012    Years since quitting: 10.2  . Smokeless tobacco: Never Used  Vaping Use  . Vaping Use: Never used  Substance Use Topics  . Alcohol use: Yes    Comment: rare  . Drug use: Not Currently    Review of Systems  Constitutional: No fever/chills Eyes: No visual changes. ENT: No sore throat. Cardiovascular: Denies chest pain. Respiratory: Denies shortness of breath. Gastrointestinal: No abdominal pain.  No nausea, no vomiting.  No diarrhea.  No constipation. Genitourinary: Positive for dysuria. Musculoskeletal: Negative for back pain. Skin: Negative for rash. Neurological: Negative for headaches, focal weakness or numbness.  ____________________________________________   PHYSICAL EXAM:  VITAL SIGNS: ED Triage Vitals  Enc Vitals Group     BP 07/09/20 2052 (!) 166/92     Pulse Rate 07/09/20 2052 100     Resp 07/09/20 2052 (!) 24     Temp 07/09/20 2052 98.6 F (37 C)     Temp src --      SpO2 07/09/20 2052 98 %     Weight 07/09/20 2053 180 lb (81.6 kg)     Height 07/09/20 2053 5\' 4"  (1.626 m)     Head Circumference --      Peak Flow --      Pain Score 07/09/20 2053 10     Pain Loc --      Pain Edu? --      Excl. in [place]? --     Constitutional: Alert and oriented. Eyes: Conjunctivae are normal. Head: Atraumatic. Nose: No congestion/rhinnorhea. Mouth/Throat: Mucous membranes are moist. Neck: Normal ROM Cardiovascular: Normal rate, regular rhythm. Grossly normal heart sounds. Respiratory: Normal respiratory effort.  No retractions. Lungs CTAB. Gastrointestinal: Soft and nontender.  No  CVA tenderness bilaterally.  No distention. Genitourinary: deferred Musculoskeletal: No lower extremity tenderness nor edema. Neurologic:  Normal speech and language. No gross focal neurologic deficits are appreciated. Skin:  Skin is warm, dry and intact. No rash noted. Psychiatric: Mood and affect are normal. Speech and behavior are normal.  ____________________________________________   LABS (all labs ordered are listed, but only abnormal results are displayed)  Labs Reviewed  URINALYSIS, COMPLETE (UACMP) WITH MICROSCOPIC - Abnormal; Notable for the following components:      Result Value   Color, Urine RED (*)    APPearance TURBID (*)    Glucose, UA   (*)    Value: TEST NOT REPORTED DUE TO COLOR INTERFERENCE OF URINE PIGMENT   Hgb urine dipstick   (*)  Value: TEST NOT REPORTED DUE TO COLOR INTERFERENCE OF URINE PIGMENT   Bilirubin Urine   (*)    Value: TEST NOT REPORTED DUE TO COLOR INTERFERENCE OF URINE PIGMENT   Ketones, ur   (*)    Value: TEST NOT REPORTED DUE TO COLOR INTERFERENCE OF URINE PIGMENT   Protein, ur   (*)    Value: TEST NOT REPORTED DUE TO COLOR INTERFERENCE OF URINE PIGMENT   Nitrite   (*)    Value: TEST NOT REPORTED DUE TO COLOR INTERFERENCE OF URINE PIGMENT   Leukocytes,Ua   (*)    Value: TEST NOT REPORTED DUE TO COLOR INTERFERENCE OF URINE PIGMENT   RBC / HPF >50 (*)    WBC, UA >50 (*)    All other components within normal limits  BASIC METABOLIC PANEL - Abnormal; Notable for the following components:   Sodium 134 (*)    CO2 21 (*)    Glucose, Bld 111 (*)    All other components within normal limits  CBC - Abnormal; Notable for the following components:   WBC 14.1 (*)    Platelets 450 (*)    All other components within normal limits  URINE CULTURE    PROCEDURES  Procedure(s) performed (including Critical Care):  Procedures   ____________________________________________   INITIAL IMPRESSION / ASSESSMENT AND PLAN / ED COURSE        70 year old female with past medical history of hyperlipidemia, hypothyroidism, and GERD who presents to the ED complaining of suprapubic pressure with dysuria and hematuria for the past 12 hours.  Symptoms appear consistent with acute cystitis, no findings to suggest pyelonephritis or nephrolithiasis.  UA is consistent with infection, we will send for culture and treat with Keflex.  Patient was counseled to follow-up with her PCP and to return to the ED for new worsening symptoms, patient agrees with plan.      ____________________________________________   FINAL CLINICAL IMPRESSION(S) / ED DIAGNOSES  Final diagnoses:  Lower urinary tract infectious disease     ED Discharge Orders         Ordered    cephALEXin (KEFLEX) 500 MG capsule  2 times daily        07/09/20 2245           Note:  This document was prepared using Dragon voice recognition software and may include unintentional dictation errors.   Blake Divine, MD 07/09/20 [phone]

## 2020-07-12 LAB — URINE CULTURE: Culture: >=100000 — AB

## 2020-07-28 ENCOUNTER — Other Ambulatory Visit: Payer: Self-pay | Admitting: Internal Medicine

## 2020-07-28 DIAGNOSIS — Z1231 Encounter for screening mammogram for malignant neoplasm of breast: Secondary | ICD-10-CM

## 2020-08-08 ENCOUNTER — Other Ambulatory Visit: Payer: Self-pay | Admitting: Family Medicine

## 2020-08-13 ENCOUNTER — Other Ambulatory Visit: Payer: Self-pay

## 2020-08-13 ENCOUNTER — Ambulatory Visit
Admission: RE | Admit: 2020-08-13 | Discharge: 2020-08-13 | Disposition: A | Discharge status: Home / Self Care | Payer: Managed Care, Other (non HMO) | Source: Ambulatory Visit | Attending: Internal Medicine | Admitting: Internal Medicine

## 2020-08-13 DIAGNOSIS — Z1231 Encounter for screening mammogram for malignant neoplasm of breast: Secondary | ICD-10-CM | POA: Diagnosis present

## 2020-08-27 ENCOUNTER — Other Ambulatory Visit: Payer: Self-pay | Admitting: Family Medicine

## 2020-08-31 ENCOUNTER — Other Ambulatory Visit: Payer: Self-pay | Admitting: Family Medicine

## 2020-12-16 IMAGING — CT CT HEAD W/O CM
4 series · 17 of 47 positions shown, 19 images · non-contrast
Comparison: MRI brain 10/16/2019

CLINICAL DATA: Headache and dizzy

EXAM:
CT HEAD WITHOUT CONTRAST
TECHNIQUE: Contiguous axial images were obtained from the base of the skull
through the vertex without intravenous contrast.

[Series 2: head wo · axial · 0.42mm/px · z∈[+295,+415]mm · 7 of 32 slices shown, 9 images]
[im 4/32  brain]
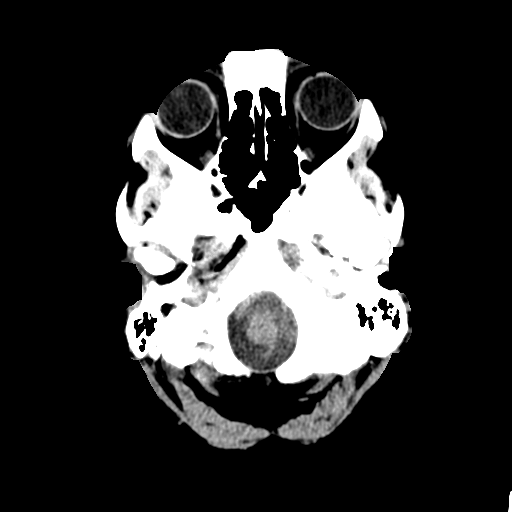
[im 4/32  bone]
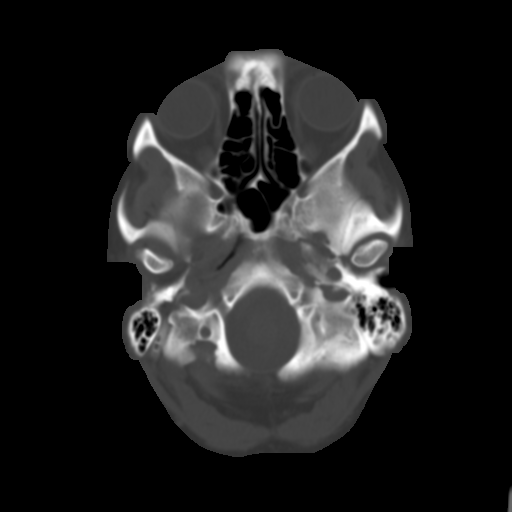
[im 8/32  brain]
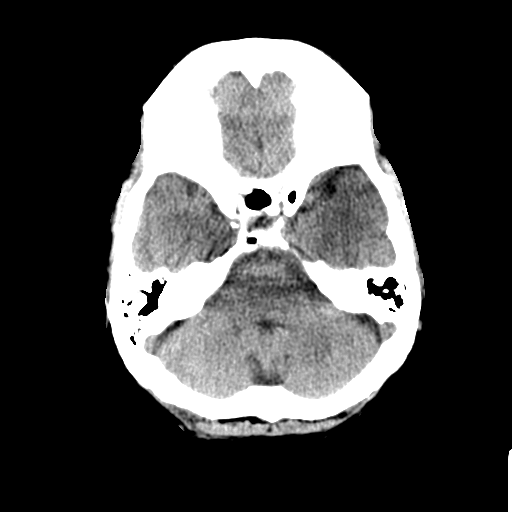
[im 12/32  brain]
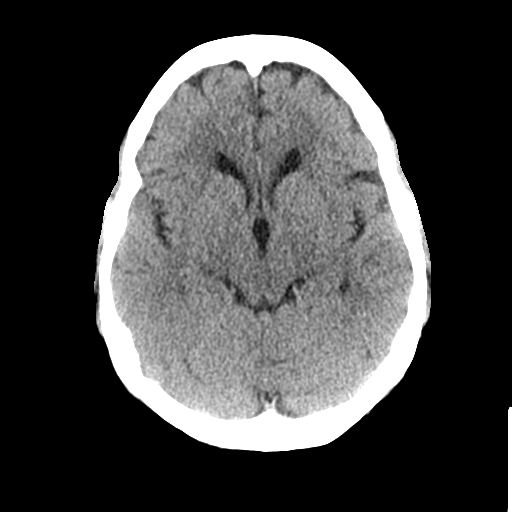
[im 16/32  brain]
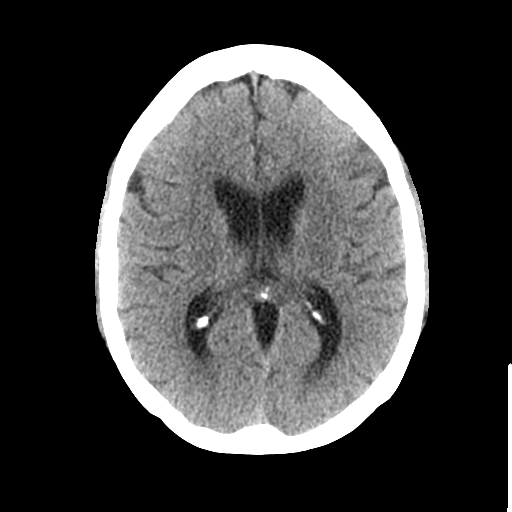
[im 20/32  brain]
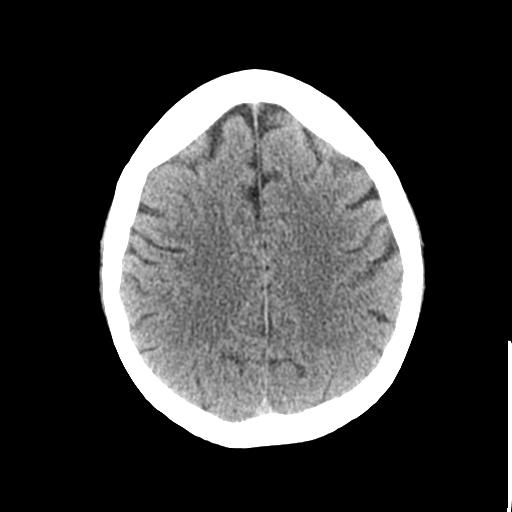
[im 20/32  bone]
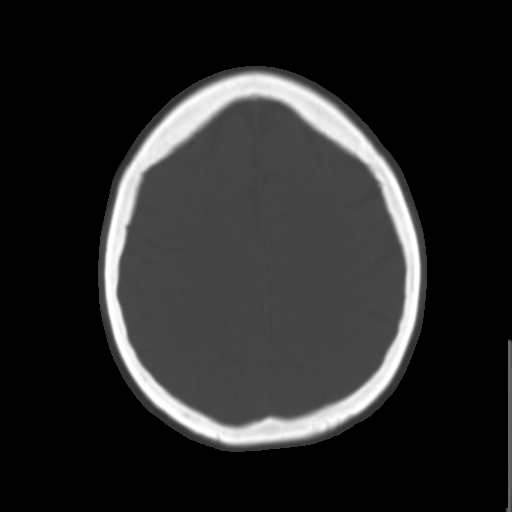
[im 24/32  brain]
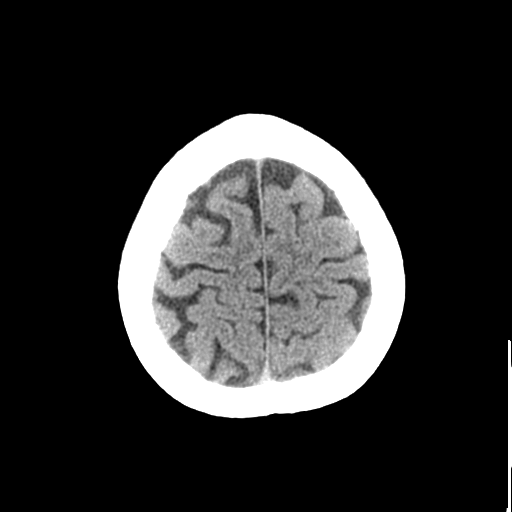
[im 28/32  brain]
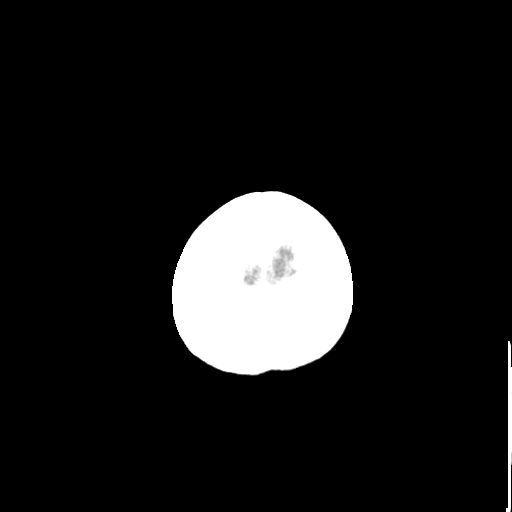

[Series 3: head bone · axial · 0.42mm/px · z∈[+294,+350]mm · 4 of 80 slices shown]
[im 8/80  bone]
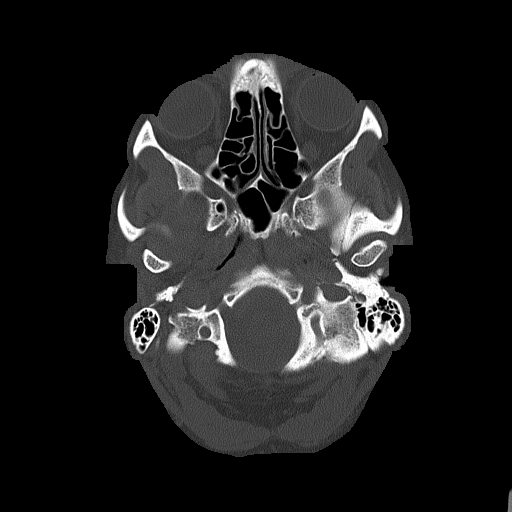
[im 16/80  bone]
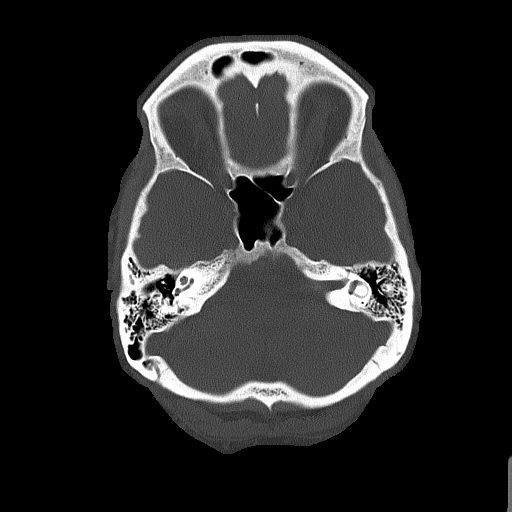
[im 24/80  bone]
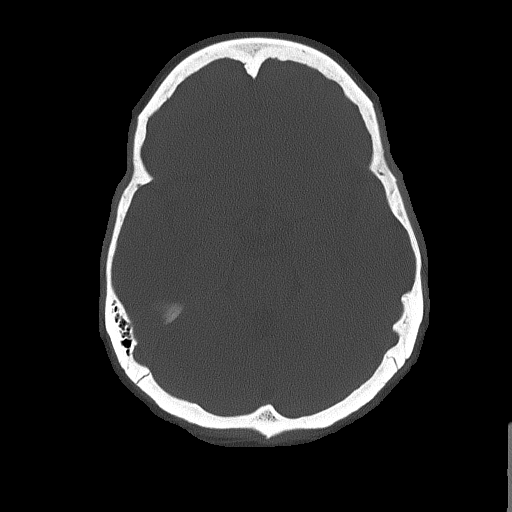
[im 36/80  bone]
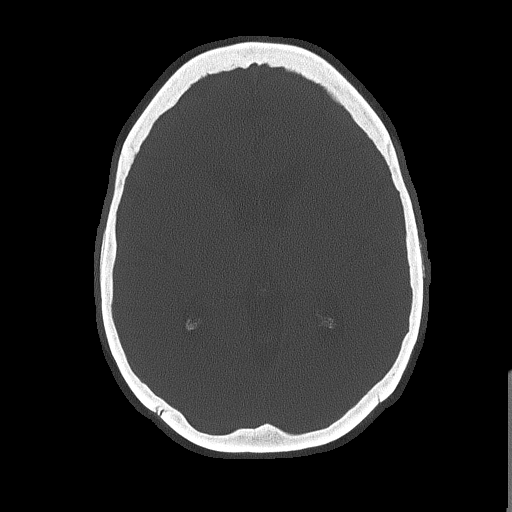

[Series 4: coronal soft tissue · coronal · 0.31mm/px · 3 of 67 slices shown]
[im 23/67  brain]
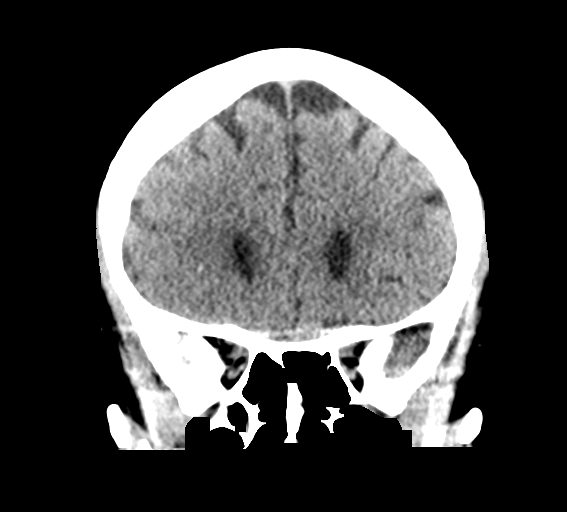
[im 30/67  brain]
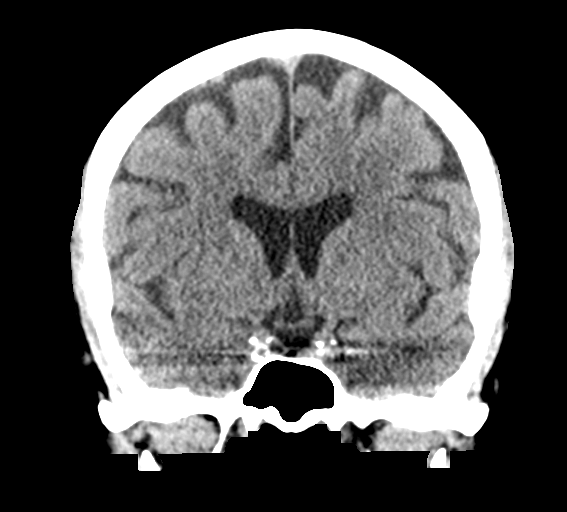
[im 37/67  brain]
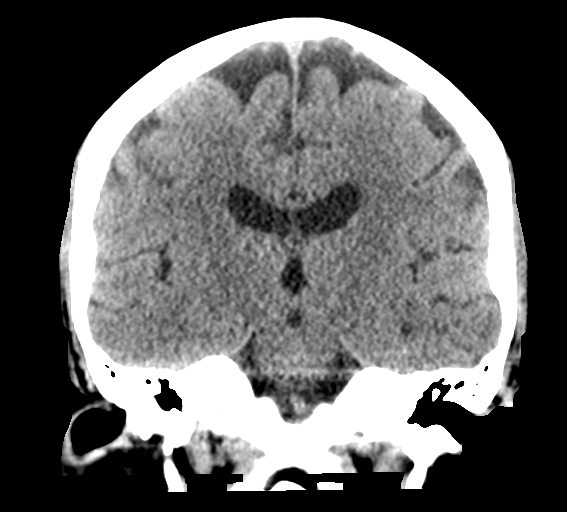

[Series 5: sagittal soft tissue · sagittal · 0.34mm/px · 3 of 58 slices shown]
[im 20/58  brain]
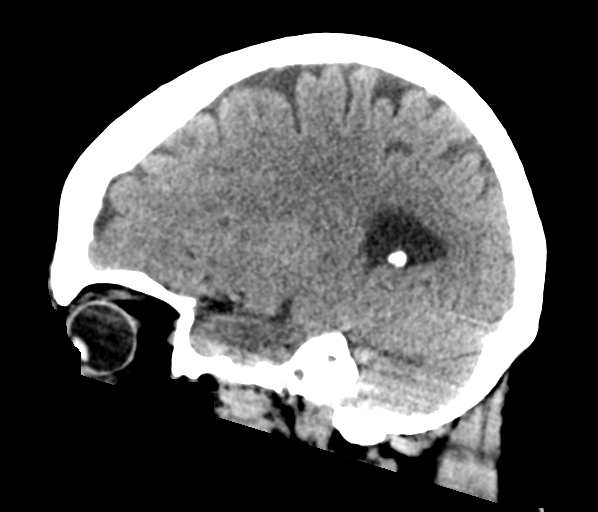
[im 29/58  brain]
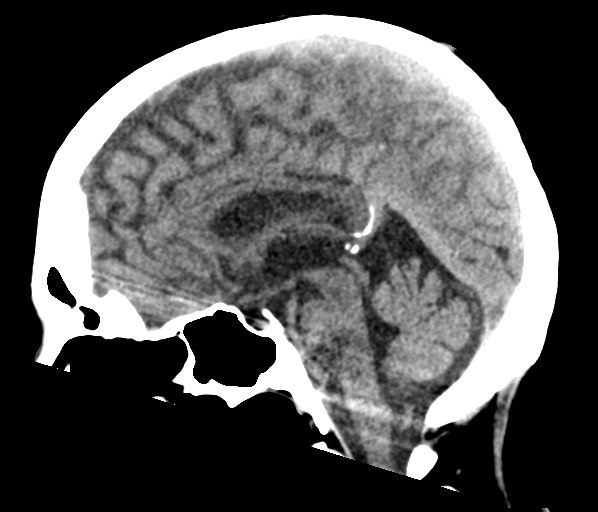
[im 39/58  brain]
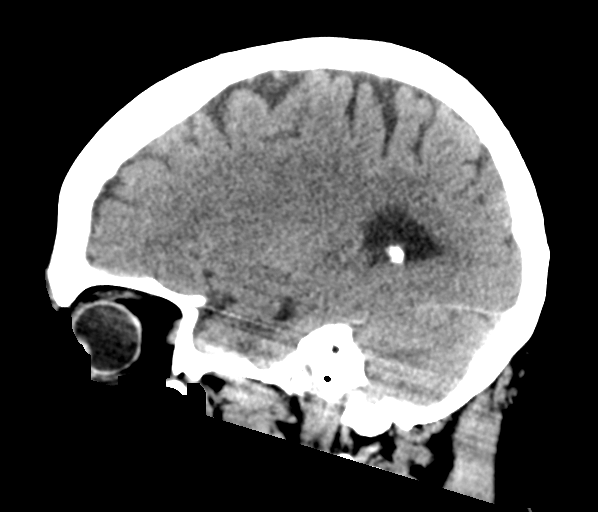

[17 of 47 positions shown; findings below may reference images not displayed]

FINDINGS: Brain: No evidence of acute infarction, hemorrhage, hydrocephalus,
extra-axial collection or mass lesion/mass effect.

Vascular: No hyperdense vessels. Mild carotid vascular calcification

Skull: Normal. Negative for fracture or focal lesion.

Sinuses/Orbits: No acute finding.

Other: None
IMPRESSION: Negative non contrasted CT appearance of the brain.

## 2021-03-18 DIAGNOSIS — I6523 Occlusion and stenosis of bilateral carotid arteries: Secondary | ICD-10-CM | POA: Insufficient documentation

## 2021-07-05 ENCOUNTER — Emergency Department
Admission: EM | Admit: 2021-07-05 | Discharge: 2021-07-05 | Disposition: A | Discharge status: Home / Self Care | Payer: Managed Care, Other (non HMO) | Attending: Emergency Medicine | Admitting: Emergency Medicine

## 2021-07-05 ENCOUNTER — Emergency Department: Payer: Managed Care, Other (non HMO)

## 2021-07-05 DIAGNOSIS — Z20822 Contact with and (suspected) exposure to covid-19: Secondary | ICD-10-CM | POA: Insufficient documentation

## 2021-07-05 DIAGNOSIS — J069 Acute upper respiratory infection, unspecified: Secondary | ICD-10-CM | POA: Insufficient documentation

## 2021-07-05 DIAGNOSIS — R030 Elevated blood-pressure reading, without diagnosis of hypertension: Secondary | ICD-10-CM | POA: Insufficient documentation

## 2021-07-05 DIAGNOSIS — R059 Cough, unspecified: Secondary | ICD-10-CM | POA: Diagnosis present

## 2021-07-05 LAB — RESP PANEL BY RT-PCR (FLU A&B, COVID) ARPGX2
Influenza A by PCR: NEGATIVE
Influenza B by PCR: NEGATIVE
SARS Coronavirus 2 by RT PCR: NEGATIVE

## 2021-07-05 MED ORDER — DEXAMETHASONE 4 MG PO TABS
10.0000 mg | ORAL_TABLET | Freq: Once | ORAL | Status: AC
  Administered 2021-07-05: 10 mg via ORAL
  Filled 2021-07-05: qty 3

## 2021-07-05 MED ORDER — ACETAMINOPHEN 500 MG PO TABS
1000.0000 mg | ORAL_TABLET | Freq: Once | ORAL | Status: AC
  Administered 2021-07-05: 1000 mg via ORAL
  Filled 2021-07-05: qty 2

## 2021-07-05 MED ORDER — BENZONATATE 100 MG PO CAPS: 100.0000 mg | ORAL_CAPSULE | Freq: Three times a day (TID) | ORAL | 0 refills | Status: AC | PRN

## 2021-07-05 NOTE — ED Triage Notes (Signed)
Pt presents via POV c/o cough and congestion since Saturday. Reports associated with watery and burning eyes. Reports seen at urgent care today and dx with allergies however believes "it is worse than allergies" ambulatory to triage. Reports non-productive cough.  ?

## 2021-07-05 NOTE — ED Provider Notes (Signed)
[facility] Provider Note    Event Date/Time   First MD Initiated Contact with Patient 07/05/21 2119     (approximate)   History   Cough   HPI  Annette Coleman is a 71 y.o. female with history of thyroid disease, anemia, ADHD who presents to the emergency department with her significant other with complaints of 2 days of nonproductive cough, sinus pressure, nasal congestion, eye burning and tearing.  No known fevers.  No vomiting or diarrhea.  States she went to urgent care today and was told that she had allergies.  Was not tested for COVID or influenza.   History provided by patient and husband.    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   Depression    Elevated blood pressure reading without diagnosis of hypertension    Hormone replacement therapy    Memory impairment    Thyroid disease    Venous insufficiency of both lower extremities 11/13/2016    Past Surgical History:  Procedure Laterality Date   ABDOMINAL HYSTERECTOMY     APPENDECTOMY     BUNIONECTOMY     CHOLECYSTECTOMY     COLONOSCOPY WITH PROPOFOL N/A 01/04/2019   Procedure: COLONOSCOPY WITH PROPOFOL;  Surgeon: Pasty Spillers, MD;  Location: [facility] ENDOSCOPY;  Service: Endoscopy;  Laterality: N/A;   complete parotidectomy     ESOPHAGOGASTRODUODENOSCOPY     jaw arthroscopy     TUBAL LIGATION      MEDICATIONS:  Prior to Admission medications   Medication Sig Start Date End Date Taking? Authorizing Provider  Apple Cider Vinegar 188 MG CAPS Take by mouth.    [provider]  buPROPion (WELLBUTRIN XL) 300 MG 24 hr tablet TAKE 1 TABLET BY MOUTH EVERY DAY 01/21/20   Danelle Berry, PA-C  cholecalciferol (VITAMIN D3) 25 MCG (1000 UT) tablet Take 1,000 Units by mouth daily.    [provider]  clobetasol cream (TEMOVATE) 0.05 % Apply 1 application  topically 2 (two) times daily. Avoid applying to face, groin, and axilla. Use as directed. Risk of skin atrophy with long-term use reviewed. Patient not taking: Reported on 02/04/2020 12/17/19   Willeen Niece, MD  cyanocobalamin 1000 MCG tablet Take 1,000 mcg by mouth daily. Patient not taking: Reported on 02/04/2020    [provider]  ELDERBERRY PO Take 1 tablet by mouth daily.    [provider]  escitalopram (LEXAPRO) 10 MG tablet TAKE 1 TABLET BY MOUTH EVERY DAY 02/17/20   Danelle Berry, PA-C  nortriptyline (PAMELOR) 10 MG capsule Take 20 mg by mouth at bedtime.    [provider]  pantoprazole (PROTONIX) 20 MG tablet TAKE 1 TABLET BY MOUTH EVERY DAY 08/14/19   Danelle Berry, PA-C  pravastatin (PRAVACHOL) 10 MG tablet Take 1 tablet (10 mg total) by mouth daily. 08/09/19   Danelle Berry, PA-C  TIROSINT 75 MCG CAPS Take 1 capsule (75 mcg total) by mouth daily before breakfast. 10/11/19   Danelle Berry, PA-C  triamcinolone cream (KENALOG) 0.1 % apply to affected areas once to twice dialy on Lower leg.  Avoid applying to face, groin, and axilla. Patient not taking: Reported on 02/04/2020 12/10/19   Willeen Niece, MD    Physical Exam   Triage Vital Signs: ED Triage Vitals  Enc Vitals Group     BP 07/05/21 2116 (!) 166/83  Pulse Rate 07/05/21 2116 83     Resp 07/05/21 2116 16     Temp 07/05/21 2116 100.2 F (37.9 C)     Temp Source 07/05/21 2116 Oral     SpO2 07/05/21 2116 94 %     Weight 07/05/21 2117 173 lb (78.5 kg)     Height 07/05/21 2117 5\' 4"  (1.626 m)     Head Circumference --      Peak Flow --      Pain Score 07/05/21 2117 6     Pain Loc --      Pain Edu? --      Excl. in GC? --     Most recent vital signs: Vitals:   07/05/21 2116 07/05/21 2342  BP: (!) 166/83   Pulse: 83 84  Resp: 16 20  Temp: 100.2 F (37.9 C)   SpO2: 94% 97%    CONSTITUTIONAL: Alert and oriented and responds appropriately to questions. Well-appearing; well-nourished HEAD:  Normocephalic, atraumatic EYES: Conjunctivae clear, pupils appear equal, sclera nonicteric ENT: normal nose; moist mucous membranes; No pharyngeal erythema or petechiae, no tonsillar hypertrophy or exudate, no uvular deviation, no unilateral swelling in posterior oropharynx, no trismus or drooling, no muffled voice, normal phonation, no stridor, airway patent.  No facial tenderness on exam. NECK: Supple, normal ROM CARD: RRR; S1 and S2 appreciated; no murmurs, no clicks, no rubs, no gallops RESP: Normal chest excursion without splinting or tachypnea; breath sounds clear and equal bilaterally; no wheezes, no rhonchi, no rales, no hypoxia or respiratory distress, speaking full sentences ABD/GI: Normal bowel sounds; non-distended; soft, non-tender, no rebound, no guarding, no peritoneal signs BACK: The back appears normal EXT: Normal ROM in all joints; no deformity noted, no edema; no cyanosis SKIN: Normal color for age and race; warm; no rash on exposed skin NEURO: Moves all extremities equally, normal speech PSYCH: The patient's mood and manner are appropriate.   ED Results / Procedures / Treatments   LABS: (all labs ordered are listed, but only abnormal results are displayed) Labs Reviewed  RESP PANEL BY RT-PCR (FLU A&B, COVID) [id number]     EKG:  EKG Interpretation  Date/Time:    Ventricular Rate:    PR Interval:    QRS Duration:   QT Interval:    QTC Calculation:   R Axis:     Text Interpretation:           RADIOLOGY: My personal review and interpretation of imaging: Chest x-ray shows no infiltrate or edema.  I have personally reviewed all radiology reports.   DG Chest 2 View  Result Date: 07/05/2021 CLINICAL DATA:  Cough and congestion. EXAM: CHEST - 2 VIEW COMPARISON:  08/21/2018 FINDINGS: The cardiomediastinal contours are normal. Minor bronchial thickening. Pulmonary vasculature is normal. No consolidation, pleural effusion, or pneumothorax. No acute osseous  abnormalities are seen. IMPRESSION: Minor bronchial thickening can be seen with bronchitis or asthma. Electronically Signed   By: Narda Rutherford M.D.   On: 07/05/2021 21:43     PROCEDURES:  Critical Care performed: No   CRITICAL CARE Performed by: Baxter Hire Naleyah Ohlinger   Total critical care time: 0 minutes  Critical care time was exclusive of separately billable procedures and treating other patients.  Critical care was necessary to treat or prevent imminent or life-threatening deterioration.  Critical care was time spent personally by me on the following activities: development of treatment plan with patient and/or surrogate as well as nursing, discussions with consultants, evaluation of patient's response to treatment,  examination of patient, obtaining history from patient or surrogate, ordering and performing treatments and interventions, ordering and review of laboratory studies, ordering and review of radiographic studies, pulse oximetry and re-evaluation of patient's condition.   Procedures    IMPRESSION / MDM / ASSESSMENT AND PLAN / ED COURSE  I reviewed the triage vital signs and the nursing notes.    Patient here with sinus pressure, congestion, cough and low-grade temperature of 100.2.  The patient is on the cardiac monitor to evaluate for evidence of arrhythmia and/or significant heart rate changes.   DIFFERENTIAL DIAGNOSIS (includes but not limited to):   Viral URI, pneumonia, COVID-19, influenza, sinusitis   PLAN: We will obtain chest x-ray, COVID and flu swabs.  Will give Tylenol.   MEDICATIONS GIVEN IN ED: Medications  acetaminophen (TYLENOL) tablet 1,000 mg (1,000 mg Oral Given 07/05/21 2228)  dexamethasone (DECADRON) tablet 10 mg (10 mg Oral Given 07/05/21 2228)     ED COURSE: Chest x-ray reviewed by myself and radiologist shows no infiltrate, edema or pneumothorax.  There are signs consistent with bronchitis.  Given oral Decadron here for symptomatic relief  for her sinus pressure.  No wheezing on exam.  No hypoxia.  Patient's COVID and flu test are negative.  Will discharge with prescription of Tessalon Perles.  No indication for antibiotics at this time.  Discussed supportive care instructions and return precautions.  At this time, I do not feel there is any life-threatening condition present. I reviewed all nursing notes, vitals, pertinent previous records.  All lab and urine results, EKGs, imaging ordered have been independently reviewed and interpreted by myself.  I reviewed all available radiology reports from any imaging ordered this visit.  Based on my assessment, I feel the patient is safe to be discharged home without further emergent workup and can continue workup as an outpatient as needed. Discussed all findings, treatment plan as well as usual and customary return precautions with patient and husband.  They verbalize understanding and are comfortable with this plan.  Outpatient follow-up has been provided as needed.  All questions have been answered.    CONSULTS: No admission needed at this time given patient is well-appearing with normal vital signs and no increased work of breathing, hypoxia.   OUTSIDE RECORDS REVIEWED: Reviewed patient's office visit with Debbra Riding at [place] clinic today.  Patient was started on Flonase and Astelin nasal sprays.         FINAL CLINICAL IMPRESSION(S) / ED DIAGNOSES   Final diagnoses:  Viral URI with cough     Rx / DC Orders   ED Discharge Orders          Ordered    benzonatate (TESSALON PERLES) 100 MG capsule  3 times daily PRN        07/05/21 2336             Note:  This document was prepared using Dragon voice recognition software and may include unintentional dictation errors.   Mecca Barga, Layla Maw, DO 07/05/21 2343

## 2021-07-05 NOTE — Discharge Instructions (Addendum)
You may alternate Tylenol 1000 mg every 6 hours as needed for pain, fever and Ibuprofen 800 mg every 6-8 hours as needed for pain, fever.  Please take Ibuprofen with food.  Do not take more than 4000 mg of Tylenol (acetaminophen) in a 24 hour period. ? ? ?Your COVID, flu test were negative today.  Your x-ray showed bronchitis which is a viral illness.  There is no sign of pneumonia and you do not need antibiotics today. ? ?

## 2021-07-08 DIAGNOSIS — R232 Flushing: Secondary | ICD-10-CM | POA: Insufficient documentation

## 2021-07-08 DIAGNOSIS — R5383 Other fatigue: Secondary | ICD-10-CM | POA: Insufficient documentation

## 2021-07-08 DIAGNOSIS — E781 Pure hyperglyceridemia: Secondary | ICD-10-CM | POA: Insufficient documentation

## 2021-07-19 ENCOUNTER — Other Ambulatory Visit: Payer: Self-pay | Admitting: Internal Medicine

## 2021-07-19 DIAGNOSIS — Z1231 Encounter for screening mammogram for malignant neoplasm of breast: Secondary | ICD-10-CM

## 2021-08-09 ENCOUNTER — Ambulatory Visit (INDEPENDENT_AMBULATORY_CARE_PROVIDER_SITE_OTHER): Payer: Managed Care, Other (non HMO) | Admitting: Podiatry

## 2021-08-09 ENCOUNTER — Ambulatory Visit (INDEPENDENT_AMBULATORY_CARE_PROVIDER_SITE_OTHER): Payer: Managed Care, Other (non HMO)

## 2021-08-09 DIAGNOSIS — M722 Plantar fascial fibromatosis: Secondary | ICD-10-CM

## 2021-08-09 DIAGNOSIS — Z780 Asymptomatic menopausal state: Secondary | ICD-10-CM | POA: Insufficient documentation

## 2021-08-09 MED ORDER — MELOXICAM 15 MG PO TABS: 15.0000 mg | ORAL_TABLET | Freq: Every day | ORAL | 3 refills | Status: AC

## 2021-08-09 NOTE — Progress Notes (Signed)
?Subjective:  ?Patient ID: Annette Coleman, female    DOB: 06-02-1950,  MRN: [id number] ?HPI ?Chief Complaint  ?Patient presents with  ? Foot Pain  ?  Patient presents today for left heel pain.  She says it aches all the time but worse when standing or walking  ? ? ?71 y.o. female presents with the above complaint.  ? ?ROS: Denies fever chills nausea vomiting muscle aches pains calf pain back pain chest pain shortness of breath. ? ?Past Medical History:  ?Diagnosis Date  ? ADHD   ? Anemia   ? Anemia 12/04/2017  ? 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 Depression   ? Elevated blood pressure reading without diagnosis of hypertension   ? Hormone replacement therapy   ? Memory impairment   ? Thyroid disease   ? Venous insufficiency of both lower extremities 11/13/2016  ? ?Past Surgical History:  ?Procedure Laterality Date  ? ABDOMINAL HYSTERECTOMY    ? APPENDECTOMY    ? BUNIONECTOMY    ? CHOLECYSTECTOMY    ? COLONOSCOPY WITH PROPOFOL N/A 01/04/2019  ? Procedure: COLONOSCOPY WITH PROPOFOL;  Surgeon: Virgel Manifold, MD;  Location: [facility] ENDOSCOPY;  Service: Endoscopy;  Laterality: N/A;  ? complete parotidectomy    ? ESOPHAGOGASTRODUODENOSCOPY    ? jaw arthroscopy    ? TUBAL LIGATION    ? ? ?Current Outpatient Medications:  ?  meloxicam (MOBIC) 15 MG tablet, Take 1 tablet (15 mg total) by mouth daily., Disp: 30 tablet, Rfl: 3 ?  Apple Cider Vinegar 188 MG CAPS, Take by mouth., Disp: , Rfl:  ?  benzonatate (TESSALON PERLES) 100 MG capsule, Take 1 capsule (100 mg total) by mouth 3 (three) times daily as needed for cough., Disp: 30 capsule, Rfl: 0 ?  buPROPion (WELLBUTRIN XL) 300 MG 24 hr tablet, TAKE 1 TABLET BY MOUTH EVERY DAY, Disp: 30 tablet, Rfl: 5 ?  cholecalciferol (VITAMIN D3) 25 MCG (1000 UT) tablet, Take 1,000 Units by mouth daily., Disp: , Rfl:  ?  clobetasol cream (TEMOVATE) 4.33 %, Apply 1 application topically 2 (two) times daily. Avoid applying to face, groin,  and axilla. Use as directed. Risk of skin atrophy with long-term use reviewed. (Patient not taking: Reported on 02/04/2020), Disp: 60 g, Rfl: 1 ?  cyanocobalamin 1000 MCG tablet, Take 1,000 mcg by mouth daily. (Patient not taking: Reported on 02/04/2020), Disp: , Rfl:  ?  ELDERBERRY PO, Take 1 tablet by mouth daily., Disp: , Rfl:  ?  escitalopram (LEXAPRO) 10 MG tablet, TAKE 1 TABLET BY MOUTH EVERY DAY, Disp: 90 tablet, Rfl: 4 ?  nortriptyline (PAMELOR) 10 MG capsule, Take 20 mg by mouth at bedtime., Disp: , Rfl:  ?  pantoprazole (PROTONIX) 20 MG tablet, TAKE 1 TABLET BY MOUTH EVERY DAY, Disp: 90 tablet, Rfl: 3 ?  pravastatin (PRAVACHOL) 10 MG tablet, Take 1 tablet (10 mg total) by mouth daily., Disp: 30 tablet, Rfl: 11 ?  TIROSINT 75 MCG CAPS, Take 1 capsule (75 mcg total) by mouth daily before breakfast., Disp: 30 capsule, Rfl: 11 ?  triamcinolone cream (KENALOG) 0.1 %, apply to affected areas once to twice dialy on Lower leg.  Avoid applying to face, groin, and axilla. (Patient not taking: Reported on 02/04/2020), Disp: 45 g, Rfl: 2 ? ?No Known Allergies ?Review of Systems ?Objective:  ?There were no vitals filed for this visit. ? ?General: Well developed, nourished, in no acute distress, alert and oriented x3  ? ?Dermatological: Skin is warm, dry  and supple bilateral. Nails x 10 are well maintained; remaining integument appears unremarkable at this time. There are no open sores, no preulcerative lesions, no rash or signs of infection present. ? ?Vascular: Dorsalis Pedis artery and Posterior Tibial artery pedal pulses are 2/4 bilateral with immedate capillary fill time. Pedal hair growth present. No varicosities and no lower extremity edema present bilateral.  ? ?Neruologic: Grossly intact via light touch bilateral. Vibratory intact via tuning fork bilateral. Protective threshold with Semmes Wienstein monofilament intact to all pedal sites bilateral. Patellar and Achilles deep tendon reflexes 2+ bilateral. No  Babinski or clonus noted bilateral.  ? ?Musculoskeletal: No gross boney pedal deformities bilateral. No pain, crepitus, or limitation noted with foot and ankle range of motion bilateral. Muscular strength 5/5 in all groups tested bilateral.  Pain on palpation medial calcaneal tubercle of the left heel. ? ?Gait: Unassisted, Nonantalgic.  ? ? ?Radiographs: ? ?Radiographs taken today demonstrate soft tissue increase in density plantar fascial Caney insertion site no acute findings noted.  Retained screws to the first and fifth metatarsals left foot. ? ?Assessment & Plan:  ? ?Assessment: Planter fasciitis left. ? ?Plan: Discussed etiology pathology conservative surgical therapies.  Injected left heel 20 mg Kenalog 5 mg Marcaine point of maximal tenderness.  Tolerated procedure well put her in a plantar fascial brace.  Started her on meloxicam.  Did not put her on a steroid because she still healing and hand surgery.  Follow-up with her in 1 month ? ? ? ? ?Vanesha Athens [employer], DPM ?

## 2021-08-09 NOTE — Patient Instructions (Signed)
Plantar Fasciitis  Plantar fasciitis is a painful foot condition that affects the heel. It occurs when the band of tissue that connects the toes to the heel bone (plantar fascia) becomes irritated. This can happen as the result of exercising too much or doing other repetitive activities (overuse injury). Plantar fasciitis can cause mild irritation to severe pain that makes it difficult to walk or move. The pain is usually worse in the morning after sleeping, or after sitting or lying down for a period of time. Pain may also be worse after long periods of walking or standing. What are the causes? This condition may be caused by: Standing for long periods of time. Wearing shoes that do not have good arch support. Doing activities that put stress on joints (high-impact activities). This includes ballet and exercise that makes your heart beat faster (aerobic exercise), such as running. Being overweight. An abnormal way of walking (gait). Tight muscles in the back of your lower leg (calf). High arches in your feet or flat feet. Starting a new athletic activity. What are the signs or symptoms? The main symptom of this condition is heel pain. Pain may get worse after the following: Taking the first steps after a time of rest, especially in the morning after awakening, or after you have been sitting or lying down for a while. Long periods of standing still. Pain may decrease after 30-45 minutes of activity, such as gentle walking. How is this diagnosed? This condition may be diagnosed based on your medical history, a physical exam, and your symptoms. Your health care provider will check for: A tender area on the bottom of your foot. A high arch in your foot or flat feet. Pain when you move your foot. Difficulty moving your foot. You may have imaging tests to confirm the diagnosis, such as: X-rays. Ultrasound. MRI. How is this treated? Treatment for plantar fasciitis depends on how severe your  condition is. Treatment may include: Rest, ice, pressure (compression), and raising (elevating) the affected foot. This is called RICE therapy. Your health care provider may recommend RICE therapy along with over-the-counter pain medicines to manage your pain. Exercises to stretch your calves and your plantar fascia. A splint that holds your foot in a stretched, upward position while you sleep (night splint). Physical therapy to relieve symptoms and prevent problems in the future. Injections of steroid medicine (cortisone) to relieve pain and inflammation. Stimulating your plantar fascia with electrical impulses (extracorporeal shock wave therapy). This is usually the last treatment option before surgery. Surgery, if other treatments have not worked after 12 months. Follow these instructions at home: Managing pain, stiffness, and swelling  If directed, put ice on the painful area. To do this: Put ice in a plastic bag, or use a frozen bottle of water. Place a towel between your skin and the bag or bottle. Roll the bottom of your foot over the bag or bottle. Do this for 20 minutes, 2-3 times a day. Wear athletic shoes that have air-sole or gel-sole cushions, or try soft shoe inserts that are designed for plantar fasciitis. Elevate your foot above the level of your heart while you are sitting or lying down. Activity Avoid activities that cause pain. Ask your health care provider what activities are safe for you. Do physical therapy exercises and stretches as told by your health care provider. Try activities and forms of exercise that are easier on your joints (low impact). Examples include swimming, water aerobics, and biking. General instructions Take over-the-counter   and prescription medicines only as told by your health care provider. ?Wear a night splint while sleeping, if told by your health care provider. Loosen the splint if your toes tingle, become numb, or turn cold and blue. ?Maintain a  healthy weight, or work with your health care provider to lose weight as needed. ?Keep all follow-up visits. This is important. ?Contact a health care provider if you have: ?Symptoms that do not go away with home treatment. ?Pain that gets worse. ?Pain that affects your ability to move or do daily activities. ?Summary ?Plantar fasciitis is a painful foot condition that affects the heel. It occurs when the band of tissue that connects the toes to the heel bone (plantar fascia) becomes irritated. ?Heel pain is the main symptom of this condition. It may get worse after exercising too much or standing still for a long time. ?Treatment varies, but it usually starts with rest, ice, pressure (compression), and raising (elevating) the affected foot. This is called RICE therapy. Over-the-counter medicines can also be used to manage pain. ?This information is not intended to replace advice given to you by your health care provider. Make sure you discuss any questions you have with your health care provider. ?Document Revised: 07/22/2019 Document Reviewed: 07/22/2019 ?Elsevier Patient Education ? [place]. ?Plantar Fasciitis Rehab ?Ask your health care provider which exercises are safe for you. Do exercises exactly as told by your health care provider and adjust them as directed. It is normal to feel mild stretching, pulling, tightness, or discomfort as you do these exercises. Stop right away if you feel sudden pain or your pain gets worse. Do not begin these exercises until told by your health care provider. ?Stretching and range-of-motion exercises ?These exercises warm up your muscles and joints and improve the movement and flexibility of your foot. These exercises also help to relieve pain. ?Plantar fascia stretch ? ?Sit with your left / right leg crossed over your opposite knee. ?Hold your heel with one hand with that thumb near your arch. With your other hand, hold your toes and gently pull them back toward the  top of your foot. You should feel a stretch on the base (bottom) of your toes, or the bottom of your foot (plantar fascia), or both. ?Hold this stretch for__________ seconds. ?Slowly release your toes and return to the starting position. ?Repeat __________ times. Complete this exercise __________ times a day. ?Gastrocnemius stretch, standing ?This exercise is also called a calf (gastroc) stretch. It stretches the muscles in the back of the upper calf. ?Stand with your hands against a wall. ?Extend your left / right leg behind you, and bend your front knee slightly. ?Keeping your heels on the floor, your toes facing forward, and your back knee straight, shift your weight toward the wall. Do not arch your back. You should feel a gentle stretch in your upper calf. ?Hold this position for __________ seconds. ?Repeat __________ times. Complete this exercise __________ times a day. ?Soleus stretch, standing ?This exercise is also called a calf (soleus) stretch. It stretches the muscles in the back of the lower calf. ?Stand with your hands against a wall. ?Extend your left / right leg behind you, and bend your front knee slightly. ?Keeping your heels on the floor and your toes facing forward, bend your back knee and shift your weight slightly over your back leg. You should feel a gentle stretch deep in your lower calf. ?Hold this position for __________ seconds. ?Repeat __________ times. Complete this  exercise __________ times a day. ?Gastroc and soleus stretch, standing step ?This exercise stretches the muscles in the back of the lower leg. These muscles are in the upper calf (gastrocnemius) and the lower calf (soleus). ?Stand with the ball of your left / right foot on the front of a step. The ball of your foot is on the walking surface, right under your toes. ?Keep your other foot firmly on the same step. ?Hold on to the wall or a railing for balance. ?Slowly lift your other foot, allowing your body weight to press your  heel down over the edge of the front of the step. Keep knee straight and unbent. You should feel a stretch in your calf. ?Hold this position for __________ seconds. ?Return both feet to the step. ?Rep

## 2021-08-23 ENCOUNTER — Ambulatory Visit
Admission: RE | Admit: 2021-08-23 | Discharge: 2021-08-23 | Disposition: A | Discharge status: Home / Self Care | Payer: Managed Care, Other (non HMO) | Source: Ambulatory Visit | Attending: Internal Medicine | Admitting: Internal Medicine

## 2021-08-23 DIAGNOSIS — Z1231 Encounter for screening mammogram for malignant neoplasm of breast: Secondary | ICD-10-CM | POA: Insufficient documentation

## 2021-09-20 ENCOUNTER — Ambulatory Visit: Payer: Medicare Other | Admitting: Podiatry

## 2022-04-15 ENCOUNTER — Encounter: Payer: Self-pay | Admitting: Podiatry

## 2022-04-15 ENCOUNTER — Ambulatory Visit (INDEPENDENT_AMBULATORY_CARE_PROVIDER_SITE_OTHER): Payer: Managed Care, Other (non HMO) | Admitting: Podiatry

## 2022-04-15 ENCOUNTER — Ambulatory Visit (INDEPENDENT_AMBULATORY_CARE_PROVIDER_SITE_OTHER): Payer: Managed Care, Other (non HMO)

## 2022-04-15 DIAGNOSIS — M21612 Bunion of left foot: Secondary | ICD-10-CM

## 2022-04-15 DIAGNOSIS — M21619 Bunion of unspecified foot: Secondary | ICD-10-CM

## 2022-04-15 DIAGNOSIS — M21611 Bunion of right foot: Secondary | ICD-10-CM | POA: Diagnosis not present

## 2022-04-15 DIAGNOSIS — M79672 Pain in left foot: Secondary | ICD-10-CM

## 2022-04-15 MED ORDER — TRIAMCINOLONE ACETONIDE 10 MG/ML IJ SUSP
10.0000 mg | Freq: Once | INTRAMUSCULAR | Status: AC
  Administered 2022-04-15: 10 mg

## 2022-04-15 NOTE — Progress Notes (Signed)
Subjective:   Patient ID: Annette Coleman, female   DOB: 71 y.o.   MRN: [id number]   HPI Patient states that she has developed a reoccurrence of plantar fascial inflammation left over the last couple months    ROS      Objective:  Physical Exam Neurovascular status intact muscle strength adequate with patient's left plantar fascia Coleman to be quite sore at the insertion of the tendon    Assessment:  Reoccurrence Planter fasciitis left medial band at insertion     Plan:  Sterile prep reinjected the fascia at insertion 3 mg Dexasone Kenalog 5 mg Xylocaine advised on anti-inflammatories and's good shoe gear reappoint as symptoms indicate may require more aggressive approach if symptoms recur

## 2022-04-19 ENCOUNTER — Other Ambulatory Visit: Payer: Self-pay | Admitting: Podiatry

## 2022-04-19 DIAGNOSIS — M21619 Bunion of unspecified foot: Secondary | ICD-10-CM

## 2022-04-19 DIAGNOSIS — M79672 Pain in left foot: Secondary | ICD-10-CM

## 2022-10-03 ENCOUNTER — Other Ambulatory Visit: Payer: Self-pay

## 2022-10-03 DIAGNOSIS — Z1231 Encounter for screening mammogram for malignant neoplasm of breast: Secondary | ICD-10-CM

## 2022-10-06 ENCOUNTER — Ambulatory Visit
Admission: RE | Admit: 2022-10-06 | Discharge: 2022-10-06 | Disposition: A | Discharge status: Home / Self Care | Payer: Managed Care, Other (non HMO) | Source: Ambulatory Visit | Attending: Internal Medicine | Admitting: Internal Medicine

## 2022-10-06 DIAGNOSIS — Z1231 Encounter for screening mammogram for malignant neoplasm of breast: Secondary | ICD-10-CM | POA: Insufficient documentation

## 2023-07-01 ENCOUNTER — Other Ambulatory Visit: Payer: Self-pay

## 2023-07-01 ENCOUNTER — Emergency Department
Admission: EM | Admit: 2023-07-01 | Discharge: 2023-07-01 | Disposition: A | Discharge status: Home / Self Care | Attending: Emergency Medicine | Admitting: Emergency Medicine

## 2023-07-01 DIAGNOSIS — K529 Noninfective gastroenteritis and colitis, unspecified: Secondary | ICD-10-CM | POA: Diagnosis not present

## 2023-07-01 DIAGNOSIS — D649 Anemia, unspecified: Secondary | ICD-10-CM | POA: Diagnosis not present

## 2023-07-01 DIAGNOSIS — R197 Diarrhea, unspecified: Secondary | ICD-10-CM | POA: Diagnosis present

## 2023-07-01 LAB — RESP PANEL BY RT-PCR (RSV, FLU A&B, COVID)  RVPGX2
Influenza A by PCR: NEGATIVE
Influenza B by PCR: NEGATIVE
Resp Syncytial Virus by PCR: NEGATIVE
SARS Coronavirus 2 by RT PCR: NEGATIVE

## 2023-07-01 LAB — CBC
HCT: 42.9 % (ref 36.0–46.0)
Hemoglobin: 14.9 g/dL (ref 12.0–15.0)
MCH: 31.8 pg (ref 26.0–34.0)
MCHC: 34.7 g/dL (ref 30.0–36.0)
MCV: 91.7 fL (ref 80.0–100.0)
Platelets: 423 10*3/uL — ABNORMAL HIGH (ref 150–400)
RBC: 4.68 MIL/uL (ref 3.87–5.11)
RDW: 11.8 % (ref 11.5–15.5)
WBC: 6.2 10*3/uL (ref 4.0–10.5)
nRBC: 0 % (ref 0.0–0.2)

## 2023-07-01 LAB — COMPREHENSIVE METABOLIC PANEL
ALT: 25 U/L (ref 0–44)
AST: 30 U/L (ref 15–41)
Albumin: 4.2 g/dL (ref 3.5–5.0)
Alkaline Phosphatase: 50 U/L (ref 38–126)
Anion gap: 9 (ref 5–15)
BUN: 17 mg/dL (ref 8–23)
CO2: 22 mmol/L (ref 22–32)
Calcium: 8.9 mg/dL (ref 8.9–10.3)
Chloride: 103 mmol/L (ref 98–111)
Creatinine, Ser: 1.36 mg/dL — ABNORMAL HIGH (ref 0.44–1.00)
GFR, Estimated: 42 mL/min — ABNORMAL LOW (ref >60)
Glucose, Bld: 106 mg/dL — ABNORMAL HIGH (ref 70–99)
Potassium: 4.1 mmol/L (ref 3.5–5.1)
Sodium: 134 mmol/L — ABNORMAL LOW (ref 135–145)
Total Bilirubin: 0.7 mg/dL (ref 0.0–1.2)
Total Protein: 8 g/dL (ref 6.5–8.1)

## 2023-07-01 LAB — LIPASE, BLOOD: Lipase: 26 U/L (ref 11–51)

## 2023-07-01 MED ORDER — ONDANSETRON HCL 4 MG/2ML IJ SOLN
4.0000 mg | Freq: Once | INTRAMUSCULAR | Status: AC
  Administered 2023-07-01: 4 mg via INTRAVENOUS
  Filled 2023-07-01: qty 2

## 2023-07-01 MED ORDER — ONDANSETRON 4 MG PO TBDP: 4.0000 mg | ORAL_TABLET | Freq: Three times a day (TID) | ORAL | 0 refills | Status: AC | PRN

## 2023-07-01 MED ORDER — SODIUM CHLORIDE 0.9 % IV SOLN
Freq: Once | INTRAVENOUS | Status: AC
Start: 2023-07-01 — End: 2023-07-01

## 2023-07-01 NOTE — ED Triage Notes (Addendum)
 First nurse note: Pt ambulatory to triage reporting N/V/D and abd pain since Wednesday. Pt works at [employer] and unsure of sick contacts

## 2023-07-01 NOTE — ED Provider Notes (Signed)
 [facility] Provider Note    Event Date/Time   First MD Initiated Contact with Patient 07/01/23 [id number]     (approximate)   History   Diarrhea   HPI  Annette Coleman is a 73 y.o. female with a history of anemia who presents with complaints of nausea vomiting diarrhea over the last 3 days.  She reports now she primarily has diarrhea.  Abdominal cramping has mostly improved, nausea has mostly improved.  Initially she was having nausea vomiting and diarrhea all the same time.  No sick contacts reported.  No fevers.  No abdominal pain today.  No blood in stools     Physical Exam   Triage Vital Signs: ED Triage Vitals  Encounter Vitals Group     BP 07/01/23 0926 121/79     Systolic BP Percentile --      Diastolic BP Percentile --      Pulse Rate 07/01/23 0926 82     Resp 07/01/23 0926 20     Temp 07/01/23 0926 98.9 F (37.2 C)     Temp Source 07/01/23 0926 Oral     SpO2 07/01/23 0926 100 %     Weight --      Height --      Head Circumference --      Peak Flow --      Pain Score 07/01/23 0927 5     Pain Loc --      Pain Education --      Exclude from Growth Chart --     Most recent vital signs: Vitals:   07/01/23 0926  BP: 121/79  Pulse: 82  Resp: 20  Temp: 98.9 F (37.2 C)  SpO2: 100%     General: Awake, no distress.  CV:  Good peripheral perfusion.  Resp:  Normal effort.  Abd:  No distention.  Soft, nontender Other:     ED Results / Procedures / Treatments   Labs (all labs ordered are listed, but only abnormal results are displayed) Labs Reviewed  COMPREHENSIVE METABOLIC PANEL - Abnormal; Notable for the following components:      Result Value   Sodium 134 (*)    Glucose, Bld 106 (*)    Creatinine, Ser 1.36 (*)    GFR, Estimated 42 (*)    All other components within normal limits  CBC - Abnormal; Notable for the following components:   Platelets 423 (*)    All other components within normal limits  RESP PANEL BY RT-PCR  (RSV, FLU A&B, COVID)  RVPGX2  LIPASE, BLOOD  URINALYSIS, ROUTINE W REFLEX MICROSCOPIC     EKG     RADIOLOGY     PROCEDURES:  Critical Care performed:   Procedures   MEDICATIONS ORDERED IN ED: Medications  0.9 %  sodium chloride infusion (0 mLs Intravenous Stopped 07/01/23 1208)  ondansetron (ZOFRAN) injection 4 mg (4 mg Intravenous Given 07/01/23 1057)     IMPRESSION / MDM / ASSESSMENT AND PLAN / ED COURSE  I reviewed the triage vital signs and the nursing notes. Patient's presentation is most consistent with acute presentation with potential threat to life or bodily function.  Patient presents with nausea vomiting diarrhea as detailed above, differential includes viral gastroenteritis, foodborne illness, less likely diverticulitis,  Abdominal exam is reassuring, lab work obtained which demonstrates mild elevation in creatinine, negative respiratory panel, normal white blood cell.  Patient treated with IV fluids and IV Zofran with significant improvement in symptoms.  Discussed with her  my suspicion that this is related to the norovirus, supportive care recommended, outpatient follow-up, return precautions discussed, she agrees with this plan.        FINAL CLINICAL IMPRESSION(S) / ED DIAGNOSES   Final diagnoses:  Gastroenteritis     Rx / DC Orders   ED Discharge Orders          Ordered    ondansetron (ZOFRAN-ODT) 4 MG disintegrating tablet  Every 8 hours PRN        07/01/23 1159             Note:  This document was prepared using Dragon voice recognition software and may include unintentional dictation errors.   Jene Every, MD 07/01/23 1218

## 2023-11-16 ENCOUNTER — Other Ambulatory Visit: Payer: Self-pay | Admitting: Internal Medicine

## 2023-11-16 DIAGNOSIS — Z1231 Encounter for screening mammogram for malignant neoplasm of breast: Secondary | ICD-10-CM

## 2023-12-04 ENCOUNTER — Ambulatory Visit
Admission: RE | Admit: 2023-12-04 | Discharge: 2023-12-04 | Disposition: A | Discharge status: Home / Self Care | Source: Ambulatory Visit | Attending: Internal Medicine | Admitting: Internal Medicine

## 2023-12-04 DIAGNOSIS — Z1231 Encounter for screening mammogram for malignant neoplasm of breast: Secondary | ICD-10-CM | POA: Insufficient documentation
